# Patient Record
Sex: Female | Born: 1970
Health system: Southern US, Community
[De-identification: ages and names within clinical notes are randomized; demographics above are authoritative.]

## PROBLEM LIST (undated history)

## (undated) DIAGNOSIS — R87619 Unspecified abnormal cytological findings in specimens from cervix uteri: Secondary | ICD-10-CM

## (undated) DIAGNOSIS — IMO0002 Reserved for concepts with insufficient information to code with codable children: Secondary | ICD-10-CM

## (undated) DIAGNOSIS — A539 Syphilis, unspecified: Secondary | ICD-10-CM

## (undated) DIAGNOSIS — A549 Gonococcal infection, unspecified: Secondary | ICD-10-CM

## (undated) DIAGNOSIS — A749 Chlamydial infection, unspecified: Secondary | ICD-10-CM

## (undated) HISTORY — PX: INDUCED ABORTION: SHX677

## (undated) HISTORY — PX: TUBAL LIGATION: SHX77

---

## 2000-11-22 HISTORY — PX: TUBAL LIGATION: SHX77

## 2003-05-08 ENCOUNTER — Emergency Department (HOSPITAL_COMMUNITY): Admission: EM | Admit: 2003-05-08 | Discharge: 2003-05-08 | Payer: Self-pay | Admitting: Emergency Medicine

## 2004-03-02 ENCOUNTER — Inpatient Hospital Stay (HOSPITAL_COMMUNITY): Admission: AD | Admit: 2004-03-02 | Discharge: 2004-03-02 | Payer: Self-pay | Admitting: Obstetrics and Gynecology

## 2005-01-18 ENCOUNTER — Inpatient Hospital Stay (HOSPITAL_COMMUNITY): Admission: AD | Admit: 2005-01-18 | Discharge: 2005-01-18 | Payer: Self-pay | Admitting: Obstetrics and Gynecology

## 2006-03-20 ENCOUNTER — Inpatient Hospital Stay (HOSPITAL_COMMUNITY): Admission: AD | Admit: 2006-03-20 | Discharge: 2006-03-20 | Payer: Self-pay | Admitting: Obstetrics & Gynecology

## 2006-04-01 ENCOUNTER — Ambulatory Visit: Payer: Self-pay | Admitting: Gynecology

## 2006-12-22 ENCOUNTER — Inpatient Hospital Stay (HOSPITAL_COMMUNITY): Admission: AD | Admit: 2006-12-22 | Discharge: 2006-12-22 | Payer: Self-pay | Admitting: Obstetrics and Gynecology

## 2007-02-07 ENCOUNTER — Ambulatory Visit: Payer: Self-pay | Admitting: Internal Medicine

## 2007-02-10 ENCOUNTER — Ambulatory Visit: Payer: Self-pay | Admitting: *Deleted

## 2007-02-27 ENCOUNTER — Ambulatory Visit: Payer: Self-pay | Admitting: Internal Medicine

## 2007-03-01 ENCOUNTER — Ambulatory Visit: Payer: Self-pay | Admitting: Internal Medicine

## 2007-03-08 ENCOUNTER — Encounter (INDEPENDENT_AMBULATORY_CARE_PROVIDER_SITE_OTHER): Payer: Self-pay | Admitting: *Deleted

## 2007-03-08 ENCOUNTER — Encounter (INDEPENDENT_AMBULATORY_CARE_PROVIDER_SITE_OTHER): Payer: Self-pay | Admitting: Internal Medicine

## 2007-03-08 ENCOUNTER — Ambulatory Visit: Payer: Self-pay | Admitting: Internal Medicine

## 2007-07-20 ENCOUNTER — Encounter (INDEPENDENT_AMBULATORY_CARE_PROVIDER_SITE_OTHER): Payer: Self-pay | Admitting: Internal Medicine

## 2007-07-20 DIAGNOSIS — F17200 Nicotine dependence, unspecified, uncomplicated: Secondary | ICD-10-CM | POA: Insufficient documentation

## 2007-10-11 ENCOUNTER — Encounter (INDEPENDENT_AMBULATORY_CARE_PROVIDER_SITE_OTHER): Payer: Self-pay | Admitting: *Deleted

## 2007-12-26 ENCOUNTER — Emergency Department (HOSPITAL_COMMUNITY): Admission: EM | Admit: 2007-12-26 | Discharge: 2007-12-26 | Payer: Self-pay | Admitting: Family Medicine

## 2009-06-17 ENCOUNTER — Inpatient Hospital Stay (HOSPITAL_COMMUNITY): Admission: AD | Admit: 2009-06-17 | Discharge: 2009-06-17 | Payer: Self-pay | Admitting: Obstetrics & Gynecology

## 2009-07-25 ENCOUNTER — Emergency Department (HOSPITAL_COMMUNITY): Admission: EM | Admit: 2009-07-25 | Discharge: 2009-07-25 | Payer: Self-pay | Admitting: Emergency Medicine

## 2011-02-26 LAB — COMPREHENSIVE METABOLIC PANEL
ALT: 23 U/L (ref 0–35)
AST: 35 U/L (ref 0–37)
Albumin: 4.2 g/dL (ref 3.5–5.2)
Alkaline Phosphatase: 55 U/L (ref 39–117)
BUN: 14 mg/dL (ref 6–23)
Calcium: 9.7 mg/dL (ref 8.4–10.5)
Chloride: 107 mEq/L (ref 96–112)
Total Protein: 7.8 g/dL (ref 6.0–8.3)

## 2011-02-26 LAB — URINALYSIS, ROUTINE W REFLEX MICROSCOPIC
Leukocytes, UA: NEGATIVE
Nitrite: NEGATIVE
Protein, ur: NEGATIVE mg/dL
Specific Gravity, Urine: 1.024 (ref 1.005–1.030)
Urobilinogen, UA: 0.2 mg/dL (ref 0.0–1.0)
pH: 6.5 (ref 5.0–8.0)

## 2011-02-26 LAB — LIPASE, BLOOD: Lipase: 13 U/L (ref 11–59)

## 2011-02-26 LAB — CBC
Hemoglobin: 12.5 g/dL (ref 12.0–15.0)
RBC: 3.92 MIL/uL (ref 3.87–5.11)

## 2011-02-26 LAB — DIFFERENTIAL
Basophils Absolute: 0.1 10*3/uL (ref 0.0–0.1)
Basophils Relative: 1 % (ref 0–1)
Eosinophils Absolute: 0 10*3/uL (ref 0.0–0.7)
Eosinophils Relative: 0 % (ref 0–5)
Lymphocytes Relative: 11 % — ABNORMAL LOW (ref 12–46)
Monocytes Relative: 2 % — ABNORMAL LOW (ref 3–12)
Neutro Abs: 8.7 10*3/uL — ABNORMAL HIGH (ref 1.7–7.7)
Neutrophils Relative %: 86 % — ABNORMAL HIGH (ref 43–77)

## 2011-02-26 LAB — URINE MICROSCOPIC-ADD ON

## 2011-02-26 LAB — PREGNANCY, URINE: Preg Test, Ur: NEGATIVE

## 2011-02-28 LAB — WET PREP, GENITAL: Trich, Wet Prep: NONE SEEN

## 2011-02-28 LAB — GC/CHLAMYDIA PROBE AMP, GENITAL: Chlamydia, DNA Probe: NEGATIVE

## 2011-04-09 NOTE — Group Therapy Note (Signed)
NAME:  Crystal Meyer, Crystal Meyer NO.:  [id number]   MEDICAL RECORD NO.:  [id number]          PATIENT TYPE:  WOC   LOCATION:  [facility]                   FACILITY:  [facility]   PHYSICIAN:  Ginger Carne, MD DATE OF BIRTH:  1971-11-14   DATE OF SERVICE:  04/01/2006                                    CLINIC NOTE   This patient who presents today is a 40 year old African-American female who  has had spotting and bleeding since her last menstrual period on or about 12 March 2006.  She was seen in the maternity admission unit at [facility] [place] on 30 April.  At that time she was evaluated  including an abdominal transvaginal ultrasound which, on report, appears to  be normal.  In addition, the patient underwent a urine pregnancy test which  was negative, a urinalysis which was negative, and hemoglobin which was  11.7.  The patient was at the outset upset that there was not a female in  the clinic.  I explained to her that today there are no females available,  that we would be glad to reschedule her as such if she would prefer.  The  patient became fairly belligerent and upset and insisted that I examine her  as long as there was a chaperon in the room which I explained to her is  always the case.  The patient also accused me of not listening to her and  not giving her the time of day.  I offered her to come back another time;  however, she insisted on saying because she had taken time off from work.   With Kristen in the room, a speculum exam was performed.  There was no  evidence of lesions on the cervix, but she was actively bleeding and,  therefore, a Pap smear was not performed.  Uterus was small, anteverted, and  flexed.  Both adnexa were negative.   I explained to the patient that at this point a Pap would not be performed.  She became significantly angry and upset, explained that was what she was  sent here for.  When I explained it was not a good idea  to do so at the time  of bleeding, she became even more upset.  She also stated she was sent down  here for an ultrasound in spite of the fact that she had one performed about  10 days ago.  At this point, I brought in [place], the clinic manager, who  explained to the patient what I had essentially told her regarding her  previous testing and why we do not do Pap smears.  She became equally  belligerent and angry at [place] as well as myself.  At this point, the  patient decided to leave without any further evaluation.  The patient at  this point would not cooperate and was not willing to pursue any further  evaluation, although I explained to her that, since she is now spotting,  bleeding may stop on its own anyway.           ______________________________  Ginger Carne,  MD     SHB/MEDQ  D:  04/01/2006  T:  04/02/2006  Job:  [id number]

## 2011-11-04 ENCOUNTER — Inpatient Hospital Stay (HOSPITAL_COMMUNITY)
Admission: AD | Admit: 2011-11-04 | Discharge: 2011-11-04 | Disposition: A | Discharge status: Home / Self Care | Payer: Self-pay | Source: Ambulatory Visit | Attending: Obstetrics & Gynecology | Admitting: Obstetrics & Gynecology

## 2011-11-04 ENCOUNTER — Encounter (HOSPITAL_COMMUNITY): Payer: Self-pay | Admitting: *Deleted

## 2011-11-04 DIAGNOSIS — N390 Urinary tract infection, site not specified: Secondary | ICD-10-CM | POA: Insufficient documentation

## 2011-11-04 DIAGNOSIS — R350 Frequency of micturition: Secondary | ICD-10-CM | POA: Insufficient documentation

## 2011-11-04 HISTORY — DX: Unspecified abnormal cytological findings in specimens from cervix uteri: R87.619

## 2011-11-04 HISTORY — DX: Gonococcal infection, unspecified: A54.9

## 2011-11-04 HISTORY — DX: Reserved for concepts with insufficient information to code with codable children: IMO0002

## 2011-11-04 HISTORY — DX: Syphilis, unspecified: A53.9

## 2011-11-04 HISTORY — DX: Chlamydial infection, unspecified: A74.9

## 2011-11-04 LAB — URINALYSIS, ROUTINE W REFLEX MICROSCOPIC
Ketones, ur: NEGATIVE mg/dL
Nitrite: POSITIVE — AB
Specific Gravity, Urine: 1.02 (ref 1.005–1.030)
pH: 8 (ref 5.0–8.0)

## 2011-11-04 LAB — POCT PREGNANCY, URINE: Preg Test, Ur: NEGATIVE

## 2011-11-04 LAB — URINE MICROSCOPIC-ADD ON

## 2011-11-04 MED ORDER — CIPROFLOXACIN HCL 500 MG PO TABS: 500.0000 mg | ORAL_TABLET | Freq: Two times a day (BID) | ORAL | Status: AC

## 2011-11-04 MED ORDER — PHENAZOPYRIDINE HCL 200 MG PO TABS: 200.0000 mg | ORAL_TABLET | Freq: Three times a day (TID) | ORAL | Status: AC

## 2011-11-04 NOTE — Progress Notes (Signed)
Monday at work, had urge to urinate. Frequency, pressure and spasms after going., pinkish noted when wiping after voiding.

## 2011-11-04 NOTE — ED Provider Notes (Signed)
History   Crystal Meyer is a 40 y.o. year old [id number] female who presents to MAU reporting urgency and frequency. She denies fever, chills or flank pain.    CSN: [id number] Arrival date & time: 11/04/2011 12:23 PM   None     Chief Complaint  Patient presents with  . Urinary Frequency    (Consider location/radiation/quality/duration/timing/severity/associated sxs/prior treatment) HPI  Past Medical History  Diagnosis Date  . Abnormal Pap smear     age 40, colpo and cryo  . Gonorrhea   . Chlamydia   . Syphilis     Past Surgical History  Procedure Date  . Cesarean section   . Induced abortion     x2  . Tubal ligation     Family History  Problem Relation Age of Onset  . Anesthesia problems Neg Hx     History  Substance Use Topics  . Smoking status: Current Everyday Smoker -- 0.2 packs/day for 20 years  . Smokeless tobacco: Never Used  . Alcohol Use: Yes    OB History    Grav Para Term Preterm Abortions TAB SAB Ect Mult Living   4 2 2  0 2 2 0 0 0 2      Review of Systems: Otherwise negative  Allergies  Review of patient's allergies indicates not on file.  Home Medications  No current outpatient prescriptions on file.  BP 128/87  Pulse 74  Temp(Src) 98 F (36.7 C) (Oral)  Resp 20  Ht 5\' 2"  (1.575 m)  Wt 53.524 kg (118 lb)  BMI 21.58 kg/m2  SpO2 99%  LMP 10/01/2011  Physical Exam: NAD, A&Ox 4 Abd NT, no CVAT  ED Course  Procedures (including critical care time)  Labs Reviewed  URINALYSIS, ROUTINE W REFLEX MICROSCOPIC - Abnormal; Notable for the following:    APPearance CLOUDY (*)    Hgb urine dipstick LARGE (*)    Protein, ur 30 (*)    Nitrite POSITIVE (*)    Leukocytes, UA MODERATE (*)    All other components within normal limits  URINE MICROSCOPIC-ADD ON - Abnormal; Notable for the following:    Squamous Epithelial / LPF FEW (*)    Bacteria, UA MANY (*)    All other components within normal limits  POCT PREGNANCY, URINE  POCT  PREGNANCY, URINE    1. UTI (lower urinary tract infection)     MDM  Rx Cipro. F/U PRN if no improvement in 48 hours Increase fluids  Dorathy Kinsman 11/04/2011 05:59 PM

## 2011-11-09 NOTE — ED Provider Notes (Signed)
Attestation of Attending Supervision of Advanced Practitioner: Evaluation and management procedures were performed by the PA/NP/CNM/OB Fellow under my supervision/collaboration. Chart reviewed, and agree with management and plan.  Nitasha Jewel, M.D. 11/09/2011 10:02 AM   

## 2012-09-02 ENCOUNTER — Encounter (HOSPITAL_COMMUNITY): Payer: Self-pay | Admitting: Obstetrics and Gynecology

## 2012-09-02 ENCOUNTER — Inpatient Hospital Stay (HOSPITAL_COMMUNITY)
Admission: AD | Admit: 2012-09-02 | Discharge: 2012-09-02 | Disposition: A | Discharge status: Home / Self Care | Payer: Self-pay | Source: Ambulatory Visit | Attending: Obstetrics and Gynecology | Admitting: Obstetrics and Gynecology

## 2012-09-02 DIAGNOSIS — N760 Acute vaginitis: Secondary | ICD-10-CM | POA: Insufficient documentation

## 2012-09-02 DIAGNOSIS — A499 Bacterial infection, unspecified: Secondary | ICD-10-CM

## 2012-09-02 DIAGNOSIS — B9689 Other specified bacterial agents as the cause of diseases classified elsewhere: Secondary | ICD-10-CM | POA: Insufficient documentation

## 2012-09-02 DIAGNOSIS — R109 Unspecified abdominal pain: Secondary | ICD-10-CM | POA: Insufficient documentation

## 2012-09-02 LAB — CBC
HCT: 38.4 % (ref 36.0–46.0)
Hemoglobin: 12.6 g/dL (ref 12.0–15.0)
MCH: 30.2 pg (ref 26.0–34.0)
MCHC: 32.8 g/dL (ref 30.0–36.0)
MCV: 92.1 fL (ref 78.0–100.0)
Platelets: 310 10*3/uL (ref 150–400)
RBC: 4.17 MIL/uL (ref 3.87–5.11)
RDW: 15.3 % (ref 11.5–15.5)
WBC: 6 10*3/uL (ref 4.0–10.5)

## 2012-09-02 LAB — WET PREP, GENITAL
Trich, Wet Prep: NONE SEEN
Yeast Wet Prep HPF POC: NONE SEEN

## 2012-09-02 LAB — URINALYSIS, ROUTINE W REFLEX MICROSCOPIC
Bilirubin Urine: NEGATIVE
Hgb urine dipstick: NEGATIVE
Ketones, ur: NEGATIVE mg/dL
Leukocytes, UA: NEGATIVE
Urobilinogen, UA: 0.2 mg/dL (ref 0.0–1.0)
pH: 5.5 (ref 5.0–8.0)

## 2012-09-02 LAB — POCT PREGNANCY, URINE: Preg Test, Ur: NEGATIVE

## 2012-09-02 MED ORDER — METRONIDAZOLE 500 MG PO TABS: 500.0000 mg | ORAL_TABLET | Freq: Two times a day (BID) | ORAL | Status: DC

## 2012-09-02 NOTE — MAU Note (Signed)
Pt presents to MAU with complaints of sharp abdominal pain, decreased appetite and weight loss. Pt says this has been going on for about 2 weeks. Pt says after she eats or when she is walking she will feel a random sharp pain in her stomach.

## 2012-09-02 NOTE — MAU Provider Note (Signed)
History     CSN: [id number]  Arrival date & time 09/02/12  0936   None     Chief Complaint  Patient presents with  . Abdominal Cramping  . Weight Loss    (Consider location/radiation/quality/duration/timing/severity/associated sxs/prior treatment) HPI Crystal Meyer is a 42 y.o. [id number]. LMP 08/11/12, s/p BTL. She c/o dull, achey bil low abd pain off/on x 2 wks. She also c/o decreased appetite, occ headaches. No change in discharge, no UTI S&S or GI changes, nl BM this am. Same partner x 2 yr. Past Medical History  Diagnosis Date  . Abnormal Pap smear     age 5, colpo and cryo  . Gonorrhea   . Chlamydia   . Syphilis     Past Surgical History  Procedure Date  . Cesarean section   . Induced abortion     x2  . Tubal ligation     Family History  Problem Relation Age of Onset  . Anesthesia problems Neg Hx     History  Substance Use Topics  . Smoking status: Current Every Day Smoker -- 0.2 packs/day for 20 years  . Smokeless tobacco: Never Used  . Alcohol Use: Yes    OB History    Grav Para Term Preterm Abortions TAB SAB Ect Mult Living   4 2 2  0 2 2 0 0 0 2      Review of Systems  Constitutional: Positive for appetite change. Negative for fever and chills.  Gastrointestinal: Negative.   Genitourinary: Negative for dysuria, urgency, frequency, hematuria, vaginal bleeding and vaginal discharge.    Allergies  Aspirin  Home Medications  No current outpatient prescriptions on file.  BP 126/83  Pulse 98  Temp 98.6 F (37 C) (Oral)  Resp 18  LMP 08/11/2012  Physical Exam  Constitutional: She is oriented to person, place, and time. She appears well-developed and well-nourished.  Abdominal: Soft. Bowel sounds are normal. She exhibits no distension and no mass. There is tenderness. There is no rebound and no guarding.  Genitourinary:       Pelvic: V/V-nl anatomy, skin intact. Scant thick white d/c Cx-closed Uterus-nontender, nl size Adn- no masses  palp, non tender  Musculoskeletal: Normal range of motion.  Neurological: She is alert and oriented to person, place, and time.  Skin: Skin is warm and dry.  Psychiatric: She has a normal mood and affect. Her behavior is normal.    ED Course  Procedures (including critical care time)   Labs Reviewed  URINALYSIS, ROUTINE W REFLEX MICROSCOPIC  POCT PREGNANCY, URINE  WET PREP, GENITAL  GC/CHLAMYDIA PROBE AMP, GENITAL   No results found. Results for orders placed during the hospital encounter of 09/02/12 (from the past 24 hour(s))  URINALYSIS, ROUTINE W REFLEX MICROSCOPIC     Status: Normal   Collection Time   09/02/12  9:48 AM      Component Value Range   Color, Urine YELLOW  YELLOW   APPearance CLEAR  CLEAR   Specific Gravity, Urine 1.025  1.005 - 1.030   pH 5.5  5.0 - 8.0   Glucose, UA NEGATIVE  NEGATIVE mg/dL   Hgb urine dipstick NEGATIVE  NEGATIVE   Bilirubin Urine NEGATIVE  NEGATIVE   Ketones, ur NEGATIVE  NEGATIVE mg/dL   Protein, ur NEGATIVE  NEGATIVE mg/dL   Urobilinogen, UA 0.2  0.0 - 1.0 mg/dL   Nitrite NEGATIVE  NEGATIVE   Leukocytes, UA NEGATIVE  NEGATIVE  POCT PREGNANCY, URINE     Status:  Normal   Collection Time   09/02/12  9:49 AM      Component Value Range   Preg Test, Ur NEGATIVE  NEGATIVE  WET PREP, GENITAL     Status: Abnormal   Collection Time   09/02/12 10:12 AM      Component Value Range   Yeast Wet Prep HPF POC NONE SEEN  NONE SEEN   Trich, Wet Prep NONE SEEN  NONE SEEN   Clue Cells Wet Prep HPF POC MODERATE (*) NONE SEEN   WBC, Wet Prep HPF POC FEW (*) NONE SEEN  CBC     Status: Normal   Collection Time   09/02/12 10:58 AM      Component Value Range   WBC 6.0  4.0 - 10.5 K/uL   RBC 4.17  3.87 - 5.11 MIL/uL   Hemoglobin 12.6  12.0 - 15.0 g/dL   HCT 24.4  01.0 - 27.2 %   MCV 92.1  78.0 - 100.0 fL   MCH 30.2  26.0 - 34.0 pg   MCHC 32.8  30.0 - 36.0 g/dL   RDW 53.6  64.4 - 03.4 %   Platelets 310  150 - 400 K/uL     No diagnosis  found.    MDM  ASSESSMENT:  Bacterial vaginosis Abd pain probably not GYN in origin   PLAN:  Flagyl 500 bid x 7d If symptoms continue to f/u with general medical provider

## 2012-09-03 NOTE — MAU Provider Note (Signed)
Attestation of Attending Supervision of Advanced Practitioner (CNM/NP): Evaluation and management procedures were performed by the Advanced Practitioner under my supervision and collaboration.  I have reviewed the Advanced Practitioner's note and chart, and I agree with the management and plan.  Paz Winsett 09/03/2012 5:54 AM

## 2012-09-04 LAB — GC/CHLAMYDIA PROBE AMP, GENITAL
Chlamydia, DNA Probe: NEGATIVE
GC Probe Amp, Genital: NEGATIVE

## 2012-12-20 ENCOUNTER — Inpatient Hospital Stay (HOSPITAL_COMMUNITY)
Admission: AD | Admit: 2012-12-20 | Discharge: 2012-12-20 | Disposition: A | Discharge status: Home / Self Care | Payer: Self-pay | Source: Ambulatory Visit | Attending: Obstetrics & Gynecology | Admitting: Obstetrics & Gynecology

## 2012-12-20 ENCOUNTER — Encounter (HOSPITAL_COMMUNITY): Payer: Self-pay | Admitting: *Deleted

## 2012-12-20 DIAGNOSIS — N760 Acute vaginitis: Secondary | ICD-10-CM | POA: Insufficient documentation

## 2012-12-20 DIAGNOSIS — M545 Low back pain, unspecified: Secondary | ICD-10-CM | POA: Insufficient documentation

## 2012-12-20 DIAGNOSIS — L293 Anogenital pruritus, unspecified: Secondary | ICD-10-CM | POA: Insufficient documentation

## 2012-12-20 DIAGNOSIS — B9689 Other specified bacterial agents as the cause of diseases classified elsewhere: Secondary | ICD-10-CM | POA: Insufficient documentation

## 2012-12-20 DIAGNOSIS — A499 Bacterial infection, unspecified: Secondary | ICD-10-CM

## 2012-12-20 LAB — URINALYSIS, ROUTINE W REFLEX MICROSCOPIC
Bilirubin Urine: NEGATIVE
Hgb urine dipstick: NEGATIVE
Ketones, ur: NEGATIVE mg/dL
Nitrite: NEGATIVE
Protein, ur: NEGATIVE mg/dL
Specific Gravity, Urine: >1.03 — ABNORMAL HIGH (ref 1.005–1.030)
Urobilinogen, UA: 0.2 mg/dL (ref 0.0–1.0)

## 2012-12-20 LAB — WET PREP, GENITAL: Yeast Wet Prep HPF POC: NONE SEEN

## 2012-12-20 MED ORDER — METRONIDAZOLE 500 MG PO TABS: 500.0000 mg | ORAL_TABLET | Freq: Two times a day (BID) | ORAL | Status: DC

## 2012-12-20 NOTE — MAU Note (Signed)
Patient states she has had lower abdominal and low back pain for 2 days. Has had a vaginal itching and irritation for 4-5 days. Slight burning with urination.

## 2012-12-20 NOTE — MAU Provider Note (Signed)
History     CSN: [id number]  Arrival date and time: 12/20/12 1646   First Provider Initiated Contact with Patient 12/20/12 1820      Chief Complaint  Patient presents with  . Abdominal Pain  . Back Pain   HPI 42 y.o. [id number] with vaginal itching and irritation x 4-5 days, some vaginal discharge with odor, also mild low abd and low back pain.   Past Medical History  Diagnosis Date  . Abnormal Pap smear     age 61, colpo and cryo  . Gonorrhea   . Chlamydia   . Syphilis     Past Surgical History  Procedure Date  . Cesarean section   . Induced abortion     x2  . Tubal ligation     Family History  Problem Relation Age of Onset  . Anesthesia problems Neg Hx     History  Substance Use Topics  . Smoking status: Current Every Day Smoker -- 0.2 packs/day for 20 years    Types: Cigarettes  . Smokeless tobacco: Never Used  . Alcohol Use: Yes    Allergies:  Allergies  Allergen Reactions  . Aspirin     Childhood reaction    Prescriptions prior to admission  Medication Sig Dispense Refill  . ibuprofen (ADVIL,MOTRIN) 200 MG tablet Take 600 mg by mouth every 6 (six) hours as needed. For menstrual cramps      . hydrocortisone cream 1 % Apply 1 application topically 2 (two) times daily. For eczema      . metroNIDAZOLE (FLAGYL) 500 MG tablet Take 1 tablet (500 mg total) by mouth 2 (two) times daily.  14 tablet  0    Review of Systems  Constitutional: Negative.   Respiratory: Negative.   Cardiovascular: Negative.   Gastrointestinal: Positive for abdominal pain. Negative for nausea, vomiting, diarrhea and constipation.  Genitourinary: Negative for dysuria, urgency, frequency, hematuria and flank pain.       Negative for vaginal bleeding, + vaginal discharge  Musculoskeletal: Positive for back pain.  Neurological: Negative.   Psychiatric/Behavioral: Negative.    Physical Exam   Blood pressure 116/70, pulse 80, temperature 97.7 F (36.5 C), temperature source Oral,  resp. rate 16, height 5' 2.5" (1.588 m), weight 118 lb 3.2 oz (53.615 kg), last menstrual period 11/15/2012, SpO2 100.00%.  Physical Exam  Nursing note and vitals reviewed. Constitutional: She is oriented to person, place, and time. She appears well-developed and well-nourished. No distress.  HENT:  Head: Normocephalic and atraumatic.  Cardiovascular: Normal rate.   Respiratory: Effort normal. No respiratory distress.  GI: Soft. She exhibits no distension and no mass. There is no tenderness. There is no rebound and no guarding.  Genitourinary: There is no rash or lesion on the right labia. There is no rash or lesion on the left labia. Uterus is not deviated, not enlarged, not fixed and not tender. Cervix exhibits no motion tenderness, no discharge and no friability. Right adnexum displays tenderness (mild). Right adnexum displays no mass and no fullness. Left adnexum displays tenderness (mild). Left adnexum displays no mass and no fullness. No erythema, tenderness or bleeding around the vagina. Vaginal discharge (white) found.  Neurological: She is alert and oriented to person, place, and time.  Skin: Skin is warm and dry.  Psychiatric: She has a normal mood and affect.    MAU Course  Procedures Results for orders placed during the hospital encounter of 12/20/12 (from the past 24 hour(s))  POCT PREGNANCY, URINE  Status: Normal   Collection Time   12/20/12  6:06 PM      Component Value Range   Preg Test, Ur NEGATIVE  NEGATIVE  WET PREP, GENITAL     Status: Abnormal   Collection Time   12/20/12  6:23 PM      Component Value Range   Yeast Wet Prep HPF POC NONE SEEN  NONE SEEN   Trich, Wet Prep NONE SEEN  NONE SEEN   Clue Cells Wet Prep HPF POC FEW (*) NONE SEEN   WBC, Wet Prep HPF POC FEW (*) NONE SEEN     Assessment and Plan   1. BV (bacterial vaginosis)       Medication List     As of 12/20/2012  6:56 PM    CONTINUE taking these medications         hydrocortisone cream 1  %      ibuprofen 200 MG tablet   Commonly known as: ADVIL,MOTRIN      metroNIDAZOLE 500 MG tablet   Commonly known as: FLAGYL   Take 1 tablet (500 mg total) by mouth 2 (two) times daily.          Where to get your medications    These are the prescriptions that you need to pick up.   You may get these medications from any pharmacy.         metroNIDAZOLE 500 MG tablet            Follow-up Information    Follow up with [facility] [place]. (As needed)    Contact information:   [address] [place] [address] [phone]           Raylan Troiani 12/20/2012, 6:32 PM

## 2012-12-21 LAB — GC/CHLAMYDIA PROBE AMP
CT Probe RNA: NEGATIVE
GC Probe RNA: NEGATIVE

## 2014-09-23 ENCOUNTER — Encounter (HOSPITAL_COMMUNITY): Payer: Self-pay | Admitting: *Deleted

## 2015-05-08 ENCOUNTER — Emergency Department (HOSPITAL_COMMUNITY)
Admission: EM | Admit: 2015-05-08 | Discharge: 2015-05-08 | Disposition: A | Discharge status: Home / Self Care | Payer: Self-pay | Attending: Emergency Medicine | Admitting: Emergency Medicine

## 2015-05-08 ENCOUNTER — Encounter (HOSPITAL_COMMUNITY): Payer: Self-pay | Admitting: Emergency Medicine

## 2015-05-08 DIAGNOSIS — R1084 Generalized abdominal pain: Secondary | ICD-10-CM | POA: Insufficient documentation

## 2015-05-08 DIAGNOSIS — Z3202 Encounter for pregnancy test, result negative: Secondary | ICD-10-CM | POA: Insufficient documentation

## 2015-05-08 DIAGNOSIS — Z8619 Personal history of other infectious and parasitic diseases: Secondary | ICD-10-CM | POA: Insufficient documentation

## 2015-05-08 DIAGNOSIS — Z720 Tobacco use: Secondary | ICD-10-CM | POA: Insufficient documentation

## 2015-05-08 DIAGNOSIS — R197 Diarrhea, unspecified: Secondary | ICD-10-CM | POA: Insufficient documentation

## 2015-05-08 DIAGNOSIS — Z7952 Long term (current) use of systemic steroids: Secondary | ICD-10-CM | POA: Insufficient documentation

## 2015-05-08 LAB — URINALYSIS, ROUTINE W REFLEX MICROSCOPIC
Bilirubin Urine: NEGATIVE
Glucose, UA: NEGATIVE mg/dL
HGB URINE DIPSTICK: NEGATIVE
Ketones, ur: NEGATIVE mg/dL
Leukocytes, UA: NEGATIVE
NITRITE: NEGATIVE
PROTEIN: 100 mg/dL — AB
SPECIFIC GRAVITY, URINE: 1.023 (ref 1.005–1.030)
UROBILINOGEN UA: 0.2 mg/dL (ref 0.0–1.0)
pH: 8.5 — ABNORMAL HIGH (ref 5.0–8.0)

## 2015-05-08 LAB — CBC WITH DIFFERENTIAL/PLATELET
Basophils Absolute: 0 10*3/uL (ref 0.0–0.1)
Basophils Relative: 0 % (ref 0–1)
Eosinophils Absolute: 0.2 10*3/uL (ref 0.0–0.7)
Eosinophils Relative: 3 % (ref 0–5)
HCT: 36.8 % (ref 36.0–46.0)
Hemoglobin: 11.8 g/dL — ABNORMAL LOW (ref 12.0–15.0)
LYMPHS PCT: 20 % (ref 12–46)
Lymphs Abs: 1.5 10*3/uL (ref 0.7–4.0)
MCH: 27.2 pg (ref 26.0–34.0)
MCHC: 32.1 g/dL (ref 30.0–36.0)
MCV: 84.8 fL (ref 78.0–100.0)
MONO ABS: 0.4 10*3/uL (ref 0.1–1.0)
Monocytes Relative: 5 % (ref 3–12)
NEUTROS ABS: 5.6 10*3/uL (ref 1.7–7.7)
NEUTROS PCT: 72 % (ref 43–77)
PLATELETS: 404 10*3/uL — AB (ref 150–400)
RBC: 4.34 MIL/uL (ref 3.87–5.11)
RDW: 17.1 % — AB (ref 11.5–15.5)
WBC: 7.7 10*3/uL (ref 4.0–10.5)

## 2015-05-08 LAB — COMPREHENSIVE METABOLIC PANEL
ALBUMIN: 4.3 g/dL (ref 3.5–5.0)
ALT: 15 U/L (ref 14–54)
AST: 24 U/L (ref 15–41)
Alkaline Phosphatase: 67 U/L (ref 38–126)
Anion gap: 10 (ref 5–15)
BUN: 11 mg/dL (ref 6–20)
CALCIUM: 9.7 mg/dL (ref 8.9–10.3)
CO2: 29 mmol/L (ref 22–32)
Chloride: 103 mmol/L (ref 101–111)
Creatinine, Ser: 0.77 mg/dL (ref 0.44–1.00)
GFR calc non Af Amer: >60 mL/min (ref >60)
GLUCOSE: 105 mg/dL — AB (ref 65–99)
Potassium: 4.1 mmol/L (ref 3.5–5.1)
SODIUM: 142 mmol/L (ref 135–145)
TOTAL PROTEIN: 8.3 g/dL — AB (ref 6.5–8.1)
Total Bilirubin: 0.5 mg/dL (ref 0.3–1.2)

## 2015-05-08 LAB — URINE MICROSCOPIC-ADD ON

## 2015-05-08 LAB — I-STAT TROPONIN, ED: TROPONIN I, POC: 0 ng/mL (ref 0.00–0.08)

## 2015-05-08 LAB — LIPASE, BLOOD: Lipase: <10 U/L — ABNORMAL LOW (ref 22–51)

## 2015-05-08 LAB — POC URINE PREG, ED: Preg Test, Ur: NEGATIVE

## 2015-05-08 MED ORDER — DICYCLOMINE HCL 20 MG PO TABS: 20.0000 mg | ORAL_TABLET | Freq: Two times a day (BID) | ORAL | Status: DC

## 2015-05-08 NOTE — ED Notes (Signed)
Pt states that she has been having gen abd burning x 2 days with diarrhea.  No NV.

## 2015-05-08 NOTE — ED Provider Notes (Signed)
CSN: [id number]     Arrival date & time 05/08/15  1125 History   First MD Initiated Contact with Patient 05/08/15 1155     Chief Complaint  Patient presents with  . Abdominal Pain  . Diarrhea     (Consider location/radiation/quality/duration/timing/severity/associated sxs/prior Treatment) HPI Comments: Patient presents today with complaints of diffuse abdominal pain and diarrhea.  She states that symptoms have been present for the past 2 days.  She describes the pain as intermittent abdominal "spasms" and also a burning pain.  She states that pain typically lasts a few minutes and then resolves.  She denies abdominal pain at this time.  She has not taken anything for symptoms prior to arrival.  She reports 2-3 episodes of diarrhea today.  No blood in her stool.  She denies nausea or vomiting.  No fever or chills.  No urinary symptoms.  No known sick contacts.  No recent foreign travel.    Patient is a 44 y.o. female presenting with abdominal pain and diarrhea. The history is provided by the patient.  Abdominal Pain Associated symptoms: diarrhea   Diarrhea Associated symptoms: abdominal pain     Past Medical History  Diagnosis Date  . Abnormal Pap smear     age 75, colpo and cryo  . Gonorrhea   . Chlamydia   . Syphilis    Past Surgical History  Procedure Laterality Date  . Cesarean section    . Induced abortion      x2  . Tubal ligation     Family History  Problem Relation Age of Onset  . Anesthesia problems Neg Hx    History  Substance Use Topics  . Smoking status: Current Every Day Smoker -- 0.25 packs/day for 20 years    Types: Cigarettes  . Smokeless tobacco: Never Used  . Alcohol Use: Yes   OB History    Gravida Para Term Preterm AB TAB SAB Ectopic Multiple Living   4 2 2  0 2 2 0 0 0 2     Review of Systems  Gastrointestinal: Positive for abdominal pain and diarrhea.  All other systems reviewed and are negative.     Allergies  Aspirin  Home Medications    Prior to Admission medications   Medication Sig Start Date End Date Taking? Authorizing Provider  hydrocortisone cream 1 % Apply 1 application topically 2 (two) times daily as needed for itching. For eczema   Yes Historical Provider, MD  ibuprofen (ADVIL,MOTRIN) 200 MG tablet Take 600 mg by mouth every 6 (six) hours as needed. For menstrual cramps   Yes Historical Provider, MD   BP 123/81 mmHg  Pulse 84  Temp(Src) 98.5 F (36.9 C) (Oral)  Resp 16  SpO2 100%  LMP 04/07/2015 (Approximate) Physical Exam  Constitutional: She appears well-developed and well-nourished.  HENT:  Head: Normocephalic and atraumatic.  Mouth/Throat: Oropharynx is clear and moist.  Neck: Normal range of motion. Neck supple.  Cardiovascular: Normal rate, regular rhythm and normal heart sounds.   Pulmonary/Chest: Effort normal and breath sounds normal.  Abdominal: Soft. Bowel sounds are normal. She exhibits no distension and no mass. There is no tenderness. There is no rebound and no guarding.  Musculoskeletal: Normal range of motion.  Neurological: She is alert.  Skin: Skin is warm and dry.  Psychiatric: She has a normal mood and affect.  Nursing note and vitals reviewed.   ED Course  Procedures (including critical care time) Labs Review Labs Reviewed  CBC WITH DIFFERENTIAL/PLATELET - Abnormal;  Notable for the following:    Hemoglobin 11.8 (*)    RDW 17.1 (*)    Platelets 404 (*)    All other components within normal limits  COMPREHENSIVE METABOLIC PANEL - Abnormal; Notable for the following:    Glucose, Bld 105 (*)    Total Protein 8.3 (*)    All other components within normal limits  URINALYSIS, ROUTINE W REFLEX MICROSCOPIC (NOT AT [facility]) - Abnormal; Notable for the following:    pH 8.5 (*)    Protein, ur 100 (*)    All other components within normal limits  URINE MICROSCOPIC-ADD ON - Abnormal; Notable for the following:    Squamous Epithelial / LPF FEW (*)    Bacteria, UA FEW (*)    All other  components within normal limits  LIPASE, BLOOD  POC URINE PREG, ED  I-STAT TROPOININ, ED    Imaging Review No results found.   EKG Interpretation None      MDM   Final diagnoses:  None   Patient presents today with complaints of diffuse abdominal pain and diarrhea x 2 days.  She denies hematochezia or melena.  Abdomen is soft and non tender on exam.  Patient afebrile.  Labs unremarkable.  UA is negative for infection.  No vomiting or diarrhea during ED course.  Feel that the patient is stable for discharge.  Return precautions given.    Hyman Bible, PA-C 05/08/15 [place], MD 05/08/15 [phone]

## 2017-12-13 ENCOUNTER — Telehealth: Payer: Self-pay | Admitting: Family Medicine

## 2017-12-13 NOTE — Telephone Encounter (Signed)
Copied from [place]. Topic: General - Other >> Dec 13, 2017 11:22 AM Marin Olp L wrote: Called patient and left vmail to confirm that 4pm appt time on this Thursday 12/15/2016 works for her? The original appt was scheduled by an agent in the wrong patients chart and against preferences RE no back to back new patients at 2:30. Upon fixing the appt to the right patient and putting it in a 3pm slot instead, I forgot to change the length to 1 hour. I moved the appt to 4pm to accomodate her 93min length for new patients and no back to back new pt rule. We are just needed to confirm whether or not the 4pm time will work for the patient.   I left a voice message for pt to return my call, would like to confirm upcoming appointment this week.

## 2017-12-15 ENCOUNTER — Ambulatory Visit (INDEPENDENT_AMBULATORY_CARE_PROVIDER_SITE_OTHER): Payer: 59 | Admitting: Family Medicine

## 2017-12-15 ENCOUNTER — Encounter: Payer: Self-pay | Admitting: Family Medicine

## 2017-12-15 VITALS — BP 112/82 | HR 83 | Temp 98.6°F | Ht 64.0 in | Wt 143.5 lb

## 2017-12-15 DIAGNOSIS — Z862 Personal history of diseases of the blood and blood-forming organs and certain disorders involving the immune mechanism: Secondary | ICD-10-CM | POA: Diagnosis not present

## 2017-12-15 DIAGNOSIS — Z13220 Encounter for screening for lipoid disorders: Secondary | ICD-10-CM

## 2017-12-15 DIAGNOSIS — Z114 Encounter for screening for human immunodeficiency virus [HIV]: Secondary | ICD-10-CM | POA: Diagnosis not present

## 2017-12-15 DIAGNOSIS — F17210 Nicotine dependence, cigarettes, uncomplicated: Secondary | ICD-10-CM | POA: Diagnosis not present

## 2017-12-15 DIAGNOSIS — F5089 Other specified eating disorder: Secondary | ICD-10-CM

## 2017-12-15 DIAGNOSIS — Z7689 Persons encountering health services in other specified circumstances: Secondary | ICD-10-CM | POA: Diagnosis not present

## 2017-12-15 DIAGNOSIS — Z131 Encounter for screening for diabetes mellitus: Secondary | ICD-10-CM

## 2017-12-15 DIAGNOSIS — Z113 Encounter for screening for infections with a predominantly sexual mode of transmission: Secondary | ICD-10-CM | POA: Diagnosis not present

## 2017-12-15 NOTE — Progress Notes (Signed)
Patient presents to clinic today to establish care.  SUBJECTIVE: PMH: Patient is a 47 year old female with past medical history significant for tobacco use.  Patient states she has not been seen anywhere in recent years secondary to not having insurance.  Patient states she is due for Pap as her last one was in 1992.  Pt is a G4P2.  Patient endorses having heavy menstrual cycles causing her to be anemic in the past.  Patient is not currently taking iron supplements.  Patient endorses a continued craving for ice.   Pt enjoys the ice at the hospital.  Pt is interested in having labs this visit.  She would like to schedule a CPE in the future.  Allergies: Aspirin-reaction as a child states made her "dehydrated"  Past surgical history: C-section times x 2 1997 and 2002  Social history: Patient is single.  She has 2 children.  Patient is currently employed as a Chartered certified accountant.  Patient endorses cigarette use.  She is currently smoking 6-10 cigarettes/day.  Patient has been smoking since age 31.  Patient has thought about quitting.  Patient endorses social alcohol use.  Patient denies drug use.  Family Medical Hx: Dad- DM and HTN   Past Medical History:  Diagnosis Date  . Abnormal Pap smear    age 53, colpo and cryo  . Chlamydia   . Gonorrhea   . Syphilis     Past Surgical History:  Procedure Laterality Date  . CESAREAN SECTION    . INDUCED ABORTION     x2  . TUBAL LIGATION      Current Outpatient Medications on File Prior to Visit  Medication Sig Dispense Refill  . dicyclomine (BENTYL) 20 MG tablet Take 1 tablet (20 mg total) by mouth 2 (two) times daily. (Patient not taking: Reported on 12/15/2017) 10 tablet 0  . hydrocortisone cream 1 % Apply 1 application topically 2 (two) times daily as needed for itching. For eczema    . ibuprofen (ADVIL,MOTRIN) 200 MG tablet Take 600 mg by mouth every 6 (six) hours as needed. For menstrual cramps     No current facility-administered  medications on file prior to visit.     Allergies  Allergen Reactions  . Aspirin     Childhood reaction- dehydrated gave too many baby aspirins  . Hydrocodone     Family History  Problem Relation Age of Onset  . Diabetes Father   . Hypertension Father   . Anesthesia problems Neg Hx     Social History   Socioeconomic History  . Marital status: Single    Spouse name: Not on file  . Number of children: 2  . Years of education: Not on file  . Highest education level: Not on file  Social Needs  . Financial resource strain: Not on file  . Food insecurity - worry: Not on file  . Food insecurity - inability: Not on file  . Transportation needs - medical: Not on file  . Transportation needs - non-medical: Not on file  Occupational History  . Not on file  Tobacco Use  . Smoking status: Current Every Day Smoker    Packs/day: 0.25    Years: 20.00    Pack years: 5.00    Types: Cigarettes  . Smokeless tobacco: Never Used  Substance and Sexual Activity  . Alcohol use: Yes  . Drug use: No  . Sexual activity: Yes    Birth control/protection: Surgical  Other Topics Concern  . Not on  file  Social History Narrative  . Not on file    ROS General: Denies fever, chills, night sweats, changes in weight, changes in appetite  + craving ice HEENT: Denies headaches, ear pain, changes in vision, rhinorrhea, sore throat CV: Denies CP, palpitations, SOB, orthopnea Pulm: Denies SOB, cough, wheezing GI: Denies abdominal pain, nausea, vomiting, diarrhea, constipation GU: Denies dysuria, hematuria, frequency, vaginal discharge Msk: Denies muscle cramps, joint pains Neuro: Denies weakness, numbness, tingling Skin: Denies rashes, bruising Psych: Denies depression, anxiety, hallucinations  BP 112/82 (BP Location: Right Arm, Patient Position: Sitting, Cuff Size: Normal)   Pulse 83   Temp 98.6 F (37 C) (Oral)   Ht 5\' 4"  (1.626 m)   Wt 143 lb 8 oz (65.1 kg)   SpO2 99%   BMI 24.63 kg/m     Physical Exam Gen. Pleasant, well developed, well-nourished, in NAD HEENT - [place]/AT, PERRL, EOMI, conjunctive clear, no scleral icterus, no nasal drainage, pharynx without erythema or exudate.  TMs normal bilaterally.  No cervical lymphadenopathy. Lungs: no use of accessory muscles, CTAB, no wheezes, rales or rhonchi Cardiovascular: RRR, No r/g/m, no peripheral edema Abdomen: BS present, soft, nontender,nondistended Musculoskeletal: No deformities, moves all four extremities, no cyanosis or clubbing, normal tone Neuro:  A&Ox3, CN II-XII intact, normal gait Skin:  Warm, dry, intact, no lesions Psych: normal affect, mood appropriate  No results found for this or any previous visit (from the past 2160 hour(s)).  Assessment/Plan: Cigarette nicotine dependence without complication -Currently smoking 6-10 cigarettes/day since age 89 -Smoking cessation counseling greater than 3 minutes, less than 10 minutes -Discussed various options to help patient quit including nicotine patches, gum, pills. -Patient is currently considering cutting down. -We will continue to assess each visit.  Screening for cholesterol level  - Plan: Lipid panel  Routine screening for STI (sexually transmitted infection)  - Plan: RPR, C. trachomatis/N. gonorrhoeae RNA  Screening for HIV (human immunodeficiency virus)  - Plan: HIV antibody (with reflex)  History of anemia  - Plan: CBC with Differential/Platelet  Pathologic ice eating  - Plan: CBC with Differential/Platelet, Comprehensive metabolic panel  Screening for diabetes mellitus  - Plan: Hemoglobin A1c  Encounter to establish care -We reviewed the PMH, PSH, FH, SH, Meds and Allergies. -We provided refills for any medications we will prescribe as needed. -We addressed current concerns per orders and patient instructions. -We have asked for records for pertinent exams, studies, vaccines and notes from previous providers. -We have advised patient to  follow up per instructions below.  F/u in the next few wks to months for CPE/PAP.  Grier Mitts, MD

## 2017-12-16 LAB — COMPREHENSIVE METABOLIC PANEL
ALK PHOS: 76 U/L (ref 39–117)
ALT: 23 U/L (ref 0–35)
AST: 23 U/L (ref 0–37)
Albumin: 4 g/dL (ref 3.5–5.2)
BUN: 11 mg/dL (ref 6–23)
CO2: 25 meq/L (ref 19–32)
Calcium: 9.4 mg/dL (ref 8.4–10.5)
Chloride: 102 mEq/L (ref 96–112)
Creatinine, Ser: 0.69 mg/dL (ref 0.40–1.20)
GFR: 118.64 mL/min (ref >60.00)
GLUCOSE: 90 mg/dL (ref 70–99)
POTASSIUM: 4.5 meq/L (ref 3.5–5.1)
SODIUM: 136 meq/L (ref 135–145)
TOTAL PROTEIN: 7.1 g/dL (ref 6.0–8.3)
Total Bilirubin: 0.2 mg/dL (ref 0.2–1.2)

## 2017-12-16 LAB — CBC WITH DIFFERENTIAL/PLATELET
BASOS PCT: 0.3 % (ref 0.0–3.0)
Basophils Absolute: 0 10*3/uL (ref 0.0–0.1)
EOS ABS: 0.4 10*3/uL (ref 0.0–0.7)
EOS PCT: 4.8 % (ref 0.0–5.0)
HCT: 31.9 % — ABNORMAL LOW (ref 36.0–46.0)
Hemoglobin: 10 g/dL — ABNORMAL LOW (ref 12.0–15.0)
LYMPHS ABS: 1.9 10*3/uL (ref 0.7–4.0)
Lymphocytes Relative: 23.8 % (ref 12.0–46.0)
MCHC: 31.3 g/dL (ref 30.0–36.0)
MCV: 79.4 fl (ref 78.0–100.0)
MONO ABS: 0.4 10*3/uL (ref 0.1–1.0)
Monocytes Relative: 5.5 % (ref 3.0–12.0)
NEUTROS PCT: 65.6 % (ref 43.0–77.0)
Neutro Abs: 5.3 10*3/uL (ref 1.4–7.7)
PLATELETS: 462 10*3/uL — AB (ref 150.0–400.0)
RBC: 4.02 Mil/uL (ref 3.87–5.11)
RDW: 18.8 % — AB (ref 11.5–15.5)
WBC: 8.1 10*3/uL (ref 4.0–10.5)

## 2017-12-16 LAB — LIPID PANEL
Cholesterol: 214 mg/dL — ABNORMAL HIGH (ref 0–200)
HDL: 52.4 mg/dL (ref >39.00)
LDL Cholesterol: 128 mg/dL — ABNORMAL HIGH (ref 0–99)
NONHDL: 161.71
Total CHOL/HDL Ratio: 4
Triglycerides: 167 mg/dL — ABNORMAL HIGH (ref 0.0–149.0)
VLDL: 33.4 mg/dL (ref 0.0–40.0)

## 2017-12-16 LAB — C. TRACHOMATIS/N. GONORRHOEAE RNA
C. trachomatis RNA, TMA: NOT DETECTED
N. GONORRHOEAE RNA, TMA: NOT DETECTED

## 2017-12-16 LAB — HEMOGLOBIN A1C: Hgb A1c MFr Bld: 6.1 % (ref 4.6–6.5)

## 2017-12-19 LAB — HIV ANTIBODY (ROUTINE TESTING W REFLEX): HIV 1&2 Ab, 4th Generation: NONREACTIVE

## 2017-12-19 LAB — FLUORESCENT TREPONEMAL AB(FTA)-IGG-BLD: FLUORESCENT TREPONEMAL ABS: NONREACTIVE

## 2017-12-19 LAB — RPR TITER: RPR Titer: 1:2 {titer} — ABNORMAL HIGH

## 2017-12-19 LAB — RPR: RPR Ser Ql: REACTIVE — AB

## 2020-01-21 ENCOUNTER — Telehealth: Payer: Self-pay | Admitting: Family Medicine

## 2020-01-21 NOTE — Telephone Encounter (Signed)
Ok with me 

## 2020-01-21 NOTE — Telephone Encounter (Signed)
Pt would like to transfer care from Dr. Volanda Napoleon to Dr. Ethlyn Gallery. Is this okay? Thanks

## 2020-01-22 NOTE — Telephone Encounter (Signed)
ok 

## 2020-01-24 NOTE — Telephone Encounter (Signed)
Left a message for patient to schedule a toc from Dr. Volanda Napoleon to Dr. Ethlyn Gallery.

## 2020-03-28 DIAGNOSIS — N760 Acute vaginitis: Secondary | ICD-10-CM | POA: Diagnosis not present

## 2020-03-28 DIAGNOSIS — B9689 Other specified bacterial agents as the cause of diseases classified elsewhere: Secondary | ICD-10-CM | POA: Diagnosis not present

## 2020-03-28 DIAGNOSIS — B373 Candidiasis of vulva and vagina: Secondary | ICD-10-CM | POA: Diagnosis not present

## 2020-04-02 DIAGNOSIS — H524 Presbyopia: Secondary | ICD-10-CM | POA: Diagnosis not present

## 2020-06-25 ENCOUNTER — Telehealth: Payer: Self-pay | Admitting: Family Medicine

## 2020-06-25 ENCOUNTER — Other Ambulatory Visit: Payer: Self-pay

## 2020-06-25 ENCOUNTER — Encounter: Payer: Self-pay | Admitting: Family Medicine

## 2020-06-25 ENCOUNTER — Other Ambulatory Visit: Payer: Self-pay | Admitting: Family Medicine

## 2020-06-25 ENCOUNTER — Ambulatory Visit: Payer: 59 | Admitting: Family Medicine

## 2020-06-25 VITALS — BP 128/82 | HR 80 | Temp 98.9°F | Ht 64.0 in | Wt 143.5 lb

## 2020-06-25 DIAGNOSIS — Z1159 Encounter for screening for other viral diseases: Secondary | ICD-10-CM | POA: Diagnosis not present

## 2020-06-25 DIAGNOSIS — R5383 Other fatigue: Secondary | ICD-10-CM | POA: Diagnosis not present

## 2020-06-25 DIAGNOSIS — Z13220 Encounter for screening for lipoid disorders: Secondary | ICD-10-CM

## 2020-06-25 DIAGNOSIS — R739 Hyperglycemia, unspecified: Secondary | ICD-10-CM

## 2020-06-25 DIAGNOSIS — D649 Anemia, unspecified: Secondary | ICD-10-CM

## 2020-06-25 DIAGNOSIS — N920 Excessive and frequent menstruation with regular cycle: Secondary | ICD-10-CM | POA: Diagnosis not present

## 2020-06-25 DIAGNOSIS — Z720 Tobacco use: Secondary | ICD-10-CM | POA: Diagnosis not present

## 2020-06-25 MED ORDER — IBUPROFEN 600 MG PO TABS
600.0000 mg | ORAL_TABLET | Freq: Three times a day (TID) | ORAL | 1 refills | Status: DC | PRN
Start: 2020-06-25 — End: 2021-09-08
  Filled 2021-05-14: qty 90, 30d supply, fill #0

## 2020-06-25 MED ORDER — BUPROPION HCL ER (SR) 150 MG PO TB12: ORAL_TABLET | ORAL | 2 refills | Status: DC

## 2020-06-25 NOTE — Telephone Encounter (Signed)
Meds sent

## 2020-06-25 NOTE — Telephone Encounter (Signed)
Pt is calling stated that she forgot to ask during her virtual visit for some Ibuprofen for her heavy menstrual cycle.  Pharm:  [facility] on [address]

## 2020-06-25 NOTE — Progress Notes (Signed)
Crystal Meyer [place] DOB: [birth year] Encounter date: 06/25/2020  This isa 49 y.o. female who presents to establish care. Chief Complaint  Patient presents with  . Establish Care    History of present illness: Last time she was here (2019) - states that she has intense ice eating habit. Has to take iron intermittently due to fatigue. Thinks that because she takes iron intermittently her hemoglobin was stable in 10 area. States that she was anemic as child, was anemic during pregnancy (having to take extra iron). Took some iron on Saturday because feeling lethargic, fatigued, but hasn't taken any in 2.5 weeks. Wanted to see what levels really are.   Has heavy cycles. Cramps badly. Has blood clots. Has always had heavy cycles. Got tubes tied in 2010 and then felt like blood clots started and menses got heavier. They wanted to test her for fibroids, but didn't follow through since on menses at time eval was scheduled. Feels like abdomen area is larger in general. Menstruates for 5 days and heavy for 3. Has to change tampon within 2 hours or she will leak. Wearing super plus tampon.   Interested in A1C; wants to know where she is at for sugars.   Eating ice all the time has messed up her teeth.   Smoking 1/2 PPD. Quit cold [place] when pregnant. Hasn't been able to quit since.    Past Medical History:  Diagnosis Date  . Abnormal Pap smear    age 77, colpo and cryo  . Chlamydia   . Gonorrhea   . Syphilis    Past Surgical History:  Procedure Laterality Date  . CESAREAN SECTION  2002   1997, 2002  . INDUCED ABORTION     x2  . TUBAL LIGATION  2002   Allergies  Allergen Reactions  . Aspirin     Childhood reaction- dehydrated gave too many baby aspirins  . Hydrocodone    No outpatient medications have been marked as taking for the 06/25/20 encounter (Office Visit) with Caren Macadam, MD.   Social History   Tobacco Use  . Smoking status: Current Every Day Smoker    Packs/day:  0.50    Types: Cigarettes  . Smokeless tobacco: Never Used  Substance Use Topics  . Alcohol use: Yes    Comment: occasional   Family History  Problem Relation Age of Onset  . Diabetes Father   . Hypertension Father   . Healthy Sister   . High blood pressure Brother   . Lupus Mother   . Sickle cell trait Mother   . Stomach cancer Maternal Grandmother   . Diabetes Paternal Grandmother   . High blood pressure Paternal Grandmother   . Blindness Paternal Grandmother        secondary to diabetes  . Anesthesia problems Neg Hx      Review of Systems  Constitutional: Positive for fatigue. Negative for chills and fever.  Respiratory: Negative for cough, chest tightness, shortness of breath and wheezing.   Cardiovascular: Negative for chest pain, palpitations and leg swelling.  Genitourinary: Positive for menstrual problem and vaginal bleeding.    Objective:  BP 128/82 (BP Location: Left Arm, Patient Position: Sitting, Cuff Size: Normal)   Pulse 80   Temp 98.9 F (37.2 C) (Oral)   Ht 5\' 4"  (1.626 m)   Wt 143 lb 8 oz (65.1 kg)   LMP 06/05/2020 (Exact Date)   BMI 24.63 kg/m   Weight: 143 lb 8 oz (65.1 kg)   BP  Readings from Last 3 Encounters:  06/25/20 128/82  12/15/17 112/82  05/08/15 123/81   Wt Readings from Last 3 Encounters:  06/25/20 143 lb 8 oz (65.1 kg)  12/15/17 143 lb 8 oz (65.1 kg)  12/20/12 118 lb 3.2 oz (53.6 kg)    Physical Exam Constitutional:      General: She is not in acute distress.    Appearance: She is well-developed.  Eyes:     Comments: Pale conjunctiva  Neck:     Thyroid: No thyroid mass or thyroid tenderness.  Cardiovascular:     Rate and Rhythm: Normal rate and regular rhythm.     Heart sounds: Normal heart sounds. No murmur heard.  No friction rub.  Pulmonary:     Effort: Pulmonary effort is normal. No respiratory distress.     Breath sounds: Normal breath sounds. No wheezing or rales.  Abdominal:     General: Abdomen is flat. Bowel  sounds are normal. There is no distension.     Tenderness: There is abdominal tenderness (suprapubic, slight). There is no guarding.  Musculoskeletal:     Right lower leg: No edema.     Left lower leg: No edema.  Lymphadenopathy:     Cervical: No cervical adenopathy.  Neurological:     Mental Status: She is alert and oriented to person, place, and time.  Psychiatric:        Behavior: Behavior normal.     Assessment/Plan:  1. Tobacco abuse Patient desires to quit smoking.  We discussed different options for this today, and she would like to try Wellbutrin.  We will have her take as directed and she will let me know if any problems with medication. - buPROPion (WELLBUTRIN SR) 150 MG 12 hr tablet; Start with 1 tablet daily x 7 days and then increase to twice daily  Dispense: 60 tablet; Refill: 2  2. Menorrhagia with regular cycle We are going to start with blood work today, will also send for an ultrasound she has been told previously she had fibroids but has not had further assessment.  Follow-up for physical with Pap smear in the next few months. - CBC with Differential/Platelet; Future - TSH; Future - Iron, TIBC and Ferritin Panel; Future - US Pelvic Complete With Transvaginal; Future  3. Anemia, unspecified type We will recheck blood work today, including more detailed lab evaluation due to ongoing symptoms.  4. Hyperglycemia She does follow some a keto diet.  Will recheck A1c. - Comprehensive metabolic panel; Future - Hemoglobin A1c; Future  5. Fatigue, unspecified type See above.  Further investigation pending lab results. - Vitamin B12; Future - VITAMIN D 25 Hydroxy (Vit-D Deficiency, Fractures); Future  6. Lipid screening - Lipid panel; Future  7. Need for hepatitis C screening test - Hepatitis C antibody; Future  Return in about 1 month (around 07/26/2020) for physical exam.  Micheline Rough, MD

## 2020-06-25 NOTE — Patient Instructions (Signed)
[phone] QUIT NOW   

## 2020-06-26 LAB — CBC WITH DIFFERENTIAL/PLATELET
Absolute Monocytes: 460 cells/uL (ref 200–950)
Basophils Absolute: 101 cells/uL (ref 0–200)
Basophils Relative: 1.1 %
Eosinophils Absolute: 230 cells/uL (ref 15–500)
Eosinophils Relative: 2.5 %
HCT: 29.6 % — ABNORMAL LOW (ref 35.0–45.0)
Hemoglobin: 8.6 g/dL — ABNORMAL LOW (ref 11.7–15.5)
Lymphs Abs: 1978 cells/uL (ref 850–3900)
MCH: 21.1 pg — ABNORMAL LOW (ref 27.0–33.0)
MCHC: 29.1 g/dL — ABNORMAL LOW (ref 32.0–36.0)
MCV: 72.7 fL — ABNORMAL LOW (ref 80.0–100.0)
MPV: 9.9 fL (ref 7.5–12.5)
Monocytes Relative: 5 %
Neutro Abs: 6431 cells/uL (ref 1500–7800)
Neutrophils Relative %: 69.9 %
Platelets: 325 10*3/uL (ref 140–400)
RBC: 4.07 10*6/uL (ref 3.80–5.10)
RDW: 17.1 % — ABNORMAL HIGH (ref 11.0–15.0)
Total Lymphocyte: 21.5 %
WBC: 9.2 10*3/uL (ref 3.8–10.8)

## 2020-06-26 LAB — COMPREHENSIVE METABOLIC PANEL
AG Ratio: 1.3 (calc) (ref 1.0–2.5)
ALT: 12 U/L (ref 6–29)
AST: 20 U/L (ref 10–35)
Albumin: 4.2 g/dL (ref 3.6–5.1)
Alkaline phosphatase (APISO): 71 U/L (ref 31–125)
BUN: 15 mg/dL (ref 7–25)
CO2: 21 mmol/L (ref 20–32)
Calcium: 9.5 mg/dL (ref 8.6–10.2)
Chloride: 107 mmol/L (ref 98–110)
Creat: 0.7 mg/dL (ref 0.50–1.10)
Globulin: 3.3 g/dL (calc) (ref 1.9–3.7)
Glucose, Bld: 82 mg/dL (ref 65–99)
Potassium: 4.7 mmol/L (ref 3.5–5.3)
Sodium: 137 mmol/L (ref 135–146)
Total Bilirubin: 0.2 mg/dL (ref 0.2–1.2)
Total Protein: 7.5 g/dL (ref 6.1–8.1)

## 2020-06-26 LAB — HEPATITIS C ANTIBODY
Hepatitis C Ab: NONREACTIVE
SIGNAL TO CUT-OFF: 0.02 (ref <1.00)

## 2020-06-26 LAB — HEMOGLOBIN A1C
Hgb A1c MFr Bld: 5.7 % of total Hgb — ABNORMAL HIGH (ref <5.7)
Mean Plasma Glucose: 117 (calc)
eAG (mmol/L): 6.5 (calc)

## 2020-06-26 LAB — LIPID PANEL
Cholesterol: 220 mg/dL — ABNORMAL HIGH (ref <200)
HDL: 61 mg/dL (ref >=50)
LDL Cholesterol (Calc): 137 mg/dL (calc) — ABNORMAL HIGH
Non-HDL Cholesterol (Calc): 159 mg/dL (calc) — ABNORMAL HIGH (ref <130)
Total CHOL/HDL Ratio: 3.6 (calc) (ref <5.0)
Triglycerides: 117 mg/dL (ref <150)

## 2020-06-26 LAB — TSH: TSH: 0.86 mIU/L

## 2020-06-26 LAB — IRON,TIBC AND FERRITIN PANEL
%SAT: 1 % (calc) — ABNORMAL LOW (ref 16–45)
Ferritin: 3 ng/mL — ABNORMAL LOW (ref 16–232)
Iron: <10 ug/dL — ABNORMAL LOW (ref 40–190)
TIBC: 359 mcg/dL (calc) (ref 250–450)

## 2020-06-26 LAB — VITAMIN D 25 HYDROXY (VIT D DEFICIENCY, FRACTURES): Vit D, 25-Hydroxy: 8 ng/mL — ABNORMAL LOW (ref 30–100)

## 2020-06-26 LAB — VITAMIN B12: Vitamin B-12: 492 pg/mL (ref 200–1100)

## 2020-06-26 NOTE — Telephone Encounter (Signed)
Noted  

## 2020-06-27 MED ORDER — VITAMIN D (ERGOCALCIFEROL) 1.25 MG (50000 UNIT) PO CAPS
50000.0000 [IU] | ORAL_CAPSULE | ORAL | 1 refills | Status: DC
Start: 2020-06-27 — End: 2020-09-18

## 2020-06-27 MED ORDER — VITAMIN D (ERGOCALCIFEROL) 1.25 MG (50000 UNIT) PO CAPS
50000.0000 [IU] | ORAL_CAPSULE | ORAL | 0 refills | Status: DC
Start: 2020-06-27 — End: 2020-06-27

## 2020-06-27 NOTE — Addendum Note (Signed)
Addended by: Agnes Lawrence on: 06/27/2020 04:13 PM   Modules accepted: Orders

## 2020-06-27 NOTE — Addendum Note (Signed)
Addended by: Agnes Lawrence on: 06/27/2020 04:07 PM   Modules accepted: Orders

## 2020-06-30 ENCOUNTER — Encounter: Payer: Self-pay | Admitting: Family Medicine

## 2020-06-30 ENCOUNTER — Telehealth: Payer: Self-pay | Admitting: Hematology and Oncology

## 2020-06-30 NOTE — Telephone Encounter (Signed)
Received anew hem referral from Dr. Ethlyn Gallery for anemia. Pt has been cld and scheduled to see Dr. Lindi Adie on 8/11 at 345pm. Pt aware to arrive 15 minutes early.

## 2020-07-01 NOTE — Progress Notes (Signed)
[facility] NOTE  Patient Care Team: Caren Macadam, MD as PCP - General (Family Medicine)  CHIEF COMPLAINTS/PURPOSE OF CONSULTATION:  Newly diagnosed iron deficiency anemia  HISTORY OF PRESENTING ILLNESS:  Crystal Meyer 49 y.o. female is here because of recent diagnosis of iron deficiency anemia. She is referred by Dr. Ethlyn Gallery. Labs on 06/25/20 showed Hg 8.6, HCT 29.6, platelets 325, B-12 492, iron saturation 1%, ferritin 3. She presents to the clinic today for initial evaluation. She has had heavy menstrual bleeds and has been having symptoms of iron def (pica for ice) for the past 6 years (she has dental damage from long standing ice consumption). 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  I reviewed her records extensively and collaborated the history with the patient.  MEDICAL HISTORY:  Past Medical History:  Diagnosis Date  . Abnormal Pap smear    age 39, colpo and cryo  . Chlamydia   . Gonorrhea   . Syphilis     SURGICAL HISTORY: Past Surgical History:  Procedure Laterality Date  . CESAREAN SECTION  2002   1997, 2002  . INDUCED ABORTION     x2  . TUBAL LIGATION  2002    SOCIAL HISTORY: Social History   Socioeconomic History  . Marital status: Single    Spouse name: Not on file  . Number of children: 2  . Years of education: Not on file  . Highest education level: Not on file  Occupational History  . Not on file  Tobacco Use  . Smoking status: Current Every Day Smoker    Packs/day: 0.50    Types: Cigarettes  . Smokeless tobacco: Never Used  Vaping Use  . Vaping Use: Some days  Substance and Sexual Activity  . Alcohol use: Yes    Comment: occasional  . Drug use: No  . Sexual activity: Yes    Birth control/protection: Surgical  Other Topics Concern  . Not on file  Social History Narrative  . Not on file   Social Determinants of Health   Financial Resource Strain:   . Difficulty of Paying Living Expenses:   Food  Insecurity:   . Worried About Charity fundraiser in the Last Year:   . Arboriculturist in the Last Year:   Transportation Needs:   . Film/video editor (Medical):   Marland Kitchen Lack of Transportation (Non-Medical):   Physical Activity:   . Days of Exercise per Week:   . Minutes of Exercise per Session:   Stress:   . Feeling of Stress :   Social Connections:   . Frequency of Communication with Friends and Family:   . Frequency of Social Gatherings with Friends and Family:   . Attends Religious Services:   . Active Member of Clubs or Organizations:   . Attends Archivist Meetings:   Marland Kitchen Marital Status:   Intimate Partner Violence:   . Fear of Current or Ex-Partner:   . Emotionally Abused:   Marland Kitchen Physically Abused:   . Sexually Abused:     FAMILY HISTORY: Family History  Problem Relation Age of Onset  . Diabetes Father   . Hypertension Father   . Healthy Sister   . High blood pressure Brother   . Lupus Mother   . Sickle cell trait Mother   . Stomach cancer Maternal Grandmother   . Diabetes Paternal Grandmother   . High blood pressure Paternal Grandmother   . Blindness Paternal Grandmother  secondary to diabetes  . Anesthesia problems Neg Hx     ALLERGIES:  is allergic to aspirin and hydrocodone.  MEDICATIONS:  Current Outpatient Medications  Medication Sig Dispense Refill  . buPROPion (WELLBUTRIN SR) 150 MG 12 hr tablet Start with 1 tablet daily x 7 days and then increase to twice daily 60 tablet 2  . hydrocortisone cream 1 % Apply 1 application topically 2 (two) times daily as needed for itching. For eczema    . ibuprofen (ADVIL) 600 MG tablet Take 1 tablet (600 mg total) by mouth every 8 (eight) hours as needed. 90 tablet 1  . Vitamin D, Ergocalciferol, (DRISDOL) 1.25 MG (50000 UNIT) CAPS capsule Take 1 capsule (50,000 Units total) by mouth every 7 (seven) days. 12 capsule 1   No current facility-administered medications for this visit.    REVIEW OF  SYSTEMS:   As above all other systems are neg  PHYSICAL EXAMINATION: ECOG PERFORMANCE STATUS: 1 - Symptomatic but completely ambulatory  Vitals:   07/02/20 1600  BP: 126/89  Pulse: 89  Resp: 17  Temp: 98.4 F (36.9 C)  SpO2: 100%   Filed Weights   07/02/20 1600  Weight: 141 lb (64 kg)     LABORATORY DATA:  I have reviewed the data as listed Lab Results  Component Value Date   WBC 9.2 06/25/2020   HGB 8.6 (L) 06/25/2020   HCT 29.6 (L) 06/25/2020   MCV 72.7 (L) 06/25/2020   PLT 325 06/25/2020   Lab Results  Component Value Date   NA 137 06/25/2020   K 4.7 06/25/2020   CL 107 06/25/2020   CO2 21 06/25/2020    RADIOGRAPHIC STUDIES: I have personally reviewed the radiological reports and agreed with the findings in the report.  ASSESSMENT AND PLAN:  Iron deficiency anemia Secondary to heavy menstrual losses 06/25/2020: Iron saturation 1%, ferritin 3, hemoglobin 8.6, MCV 72.7, RDW 17.1 Recommendation: 1.  IV iron 2. GYN evaluation to stop the heavy menstrual bleeding  Return to clinic in 6 weeks for labs and follow-up I suspect that she will need additional IV iron infusions in the future because of extent and the severity of her menstrual losses   All questions were answered. The patient knows to call the clinic with any problems, questions or concerns.   Rulon Eisenmenger, MD, MPH 07/02/2020    I, Molly Dorshimer, am acting as scribe for Nicholas Lose, MD.  I have reviewed the above documentation for accuracy and completeness, and I agree with the above.

## 2020-07-02 ENCOUNTER — Other Ambulatory Visit: Payer: Self-pay

## 2020-07-02 ENCOUNTER — Inpatient Hospital Stay: Payer: 59 | Attending: Hematology and Oncology | Admitting: Hematology and Oncology

## 2020-07-02 DIAGNOSIS — F17210 Nicotine dependence, cigarettes, uncomplicated: Secondary | ICD-10-CM | POA: Diagnosis not present

## 2020-07-02 DIAGNOSIS — N920 Excessive and frequent menstruation with regular cycle: Secondary | ICD-10-CM | POA: Diagnosis not present

## 2020-07-02 DIAGNOSIS — D500 Iron deficiency anemia secondary to blood loss (chronic): Secondary | ICD-10-CM | POA: Diagnosis not present

## 2020-07-02 DIAGNOSIS — D509 Iron deficiency anemia, unspecified: Secondary | ICD-10-CM | POA: Diagnosis not present

## 2020-07-02 NOTE — Assessment & Plan Note (Signed)
Secondary to heavy menstrual losses 06/25/2020: Iron saturation 1%, ferritin 3, hemoglobin 8.6, MCV 72.7, RDW 17.1 Recommendation: 1.  IV iron 2. GYN evaluation to stop the heavy menstrual bleeding  Return to clinic in 6 weeks for labs and follow-up I suspect that she will need additional IV iron infusions in the future because of extent and the severity of her menstrual losses

## 2020-07-02 NOTE — Addendum Note (Signed)
Addended by: Nicholas Lose on: 07/02/2020 04:33 PM   Modules accepted: Orders

## 2020-07-03 ENCOUNTER — Telehealth: Payer: Self-pay | Admitting: Hematology and Oncology

## 2020-07-03 NOTE — Telephone Encounter (Signed)
Scheduled appt per 8/10 los - left message for patient with apt date and time

## 2020-07-04 ENCOUNTER — Inpatient Hospital Stay: Payer: 59

## 2020-07-04 ENCOUNTER — Other Ambulatory Visit: Payer: Self-pay

## 2020-07-04 ENCOUNTER — Telehealth: Payer: Self-pay | Admitting: Hematology and Oncology

## 2020-07-04 VITALS — BP 144/90 | HR 64 | Temp 98.8°F | Resp 18 | Wt 140.0 lb

## 2020-07-04 DIAGNOSIS — D500 Iron deficiency anemia secondary to blood loss (chronic): Secondary | ICD-10-CM

## 2020-07-04 DIAGNOSIS — N920 Excessive and frequent menstruation with regular cycle: Secondary | ICD-10-CM | POA: Diagnosis not present

## 2020-07-04 DIAGNOSIS — F17210 Nicotine dependence, cigarettes, uncomplicated: Secondary | ICD-10-CM | POA: Diagnosis not present

## 2020-07-04 DIAGNOSIS — D509 Iron deficiency anemia, unspecified: Secondary | ICD-10-CM | POA: Diagnosis not present

## 2020-07-04 MED ORDER — SODIUM CHLORIDE 0.9 % IV SOLN
Freq: Once | INTRAVENOUS | Status: AC
  Filled 2020-07-04: qty 250

## 2020-07-04 MED ORDER — SODIUM CHLORIDE 0.9 % IV SOLN
300.0000 mg | Freq: Once | INTRAVENOUS | Status: AC
  Administered 2020-07-04: 300 mg via INTRAVENOUS
  Filled 2020-07-04: qty 5

## 2020-07-04 NOTE — Patient Instructions (Signed)

## 2020-07-04 NOTE — Telephone Encounter (Signed)
Rescheduled per pt request. Pt request to move appt on 8/17 to 8/18, 8/19 to 8/24. Spoke with RN Merleen Nicely and ok to move. Pt is aware of appt time and date.

## 2020-07-08 ENCOUNTER — Ambulatory Visit: Payer: 59

## 2020-07-09 ENCOUNTER — Inpatient Hospital Stay: Payer: 59

## 2020-07-09 ENCOUNTER — Other Ambulatory Visit: Payer: Self-pay

## 2020-07-09 VITALS — BP 139/95 | HR 63 | Temp 98.5°F | Resp 18

## 2020-07-09 DIAGNOSIS — N920 Excessive and frequent menstruation with regular cycle: Secondary | ICD-10-CM | POA: Diagnosis not present

## 2020-07-09 DIAGNOSIS — D509 Iron deficiency anemia, unspecified: Secondary | ICD-10-CM | POA: Diagnosis not present

## 2020-07-09 DIAGNOSIS — D500 Iron deficiency anemia secondary to blood loss (chronic): Secondary | ICD-10-CM

## 2020-07-09 DIAGNOSIS — F17210 Nicotine dependence, cigarettes, uncomplicated: Secondary | ICD-10-CM | POA: Diagnosis not present

## 2020-07-09 MED ORDER — SODIUM CHLORIDE 0.9 % IV SOLN
300.0000 mg | Freq: Once | INTRAVENOUS | Status: AC
  Administered 2020-07-09: 300 mg via INTRAVENOUS
  Filled 2020-07-09: qty 10

## 2020-07-09 MED ORDER — SODIUM CHLORIDE 0.9 % IV SOLN
Freq: Once | INTRAVENOUS | Status: AC
  Filled 2020-07-09: qty 250

## 2020-07-09 NOTE — Patient Instructions (Signed)

## 2020-07-10 ENCOUNTER — Ambulatory Visit: Payer: 59

## 2020-07-15 ENCOUNTER — Other Ambulatory Visit: Payer: Self-pay

## 2020-07-15 ENCOUNTER — Inpatient Hospital Stay: Payer: 59

## 2020-07-15 VITALS — BP 160/92 | HR 66 | Temp 98.9°F | Resp 18

## 2020-07-15 DIAGNOSIS — N920 Excessive and frequent menstruation with regular cycle: Secondary | ICD-10-CM | POA: Diagnosis not present

## 2020-07-15 DIAGNOSIS — D500 Iron deficiency anemia secondary to blood loss (chronic): Secondary | ICD-10-CM

## 2020-07-15 DIAGNOSIS — F17210 Nicotine dependence, cigarettes, uncomplicated: Secondary | ICD-10-CM | POA: Diagnosis not present

## 2020-07-15 DIAGNOSIS — D509 Iron deficiency anemia, unspecified: Secondary | ICD-10-CM | POA: Diagnosis not present

## 2020-07-15 MED ORDER — SODIUM CHLORIDE 0.9 % IV SOLN
300.0000 mg | Freq: Once | INTRAVENOUS | Status: AC
  Administered 2020-07-15: 300 mg via INTRAVENOUS
  Filled 2020-07-15: qty 10

## 2020-07-15 MED ORDER — SODIUM CHLORIDE 0.9 % IV SOLN
Freq: Once | INTRAVENOUS | Status: AC
  Filled 2020-07-15: qty 250

## 2020-07-15 NOTE — Patient Instructions (Signed)

## 2020-08-13 ENCOUNTER — Inpatient Hospital Stay: Payer: 59 | Attending: Hematology and Oncology

## 2020-08-13 ENCOUNTER — Other Ambulatory Visit: Payer: Self-pay

## 2020-08-13 ENCOUNTER — Other Ambulatory Visit: Payer: Self-pay | Admitting: *Deleted

## 2020-08-13 DIAGNOSIS — D500 Iron deficiency anemia secondary to blood loss (chronic): Secondary | ICD-10-CM | POA: Insufficient documentation

## 2020-08-13 DIAGNOSIS — N920 Excessive and frequent menstruation with regular cycle: Secondary | ICD-10-CM | POA: Insufficient documentation

## 2020-08-13 DIAGNOSIS — D509 Iron deficiency anemia, unspecified: Secondary | ICD-10-CM | POA: Diagnosis present

## 2020-08-13 LAB — CMP (CANCER CENTER ONLY)
ALT: 21 U/L (ref 0–44)
AST: 21 U/L (ref 15–41)
Albumin: 3.5 g/dL (ref 3.5–5.0)
Alkaline Phosphatase: 72 U/L (ref 38–126)
Anion gap: 5 (ref 5–15)
BUN: 13 mg/dL (ref 6–20)
CO2: 29 mmol/L (ref 22–32)
Calcium: 9.6 mg/dL (ref 8.9–10.3)
Chloride: 108 mmol/L (ref 98–111)
Creatinine: 0.72 mg/dL (ref 0.44–1.00)
GFR, Est AFR Am: >60 mL/min (ref >60)
GFR, Estimated: >60 mL/min (ref >60)
Glucose, Bld: 87 mg/dL (ref 70–99)
Potassium: 4.7 mmol/L (ref 3.5–5.1)
Sodium: 142 mmol/L (ref 135–145)
Total Bilirubin: <0.2 mg/dL — ABNORMAL LOW (ref 0.3–1.2)
Total Protein: 7.3 g/dL (ref 6.5–8.1)

## 2020-08-13 LAB — CBC WITH DIFFERENTIAL (CANCER CENTER ONLY)
Abs Immature Granulocytes: 0.04 10*3/uL (ref 0.00–0.07)
Basophils Absolute: 0.1 10*3/uL (ref 0.0–0.1)
Basophils Relative: 1 %
Eosinophils Absolute: 0.3 10*3/uL (ref 0.0–0.5)
Eosinophils Relative: 3 %
HCT: 33.8 % — ABNORMAL LOW (ref 36.0–46.0)
Hemoglobin: 10.7 g/dL — ABNORMAL LOW (ref 12.0–15.0)
Immature Granulocytes: 1 %
Lymphocytes Relative: 24 %
Lymphs Abs: 1.9 10*3/uL (ref 0.7–4.0)
MCH: 26.5 pg (ref 26.0–34.0)
MCHC: 31.7 g/dL (ref 30.0–36.0)
MCV: 83.7 fL (ref 80.0–100.0)
Monocytes Absolute: 0.5 10*3/uL (ref 0.1–1.0)
Monocytes Relative: 7 %
Neutro Abs: 5.2 10*3/uL (ref 1.7–7.7)
Neutrophils Relative %: 64 %
Platelet Count: 331 10*3/uL (ref 150–400)
RBC: 4.04 MIL/uL (ref 3.87–5.11)
WBC Count: 8 10*3/uL (ref 4.0–10.5)
nRBC: 0 % (ref 0.0–0.2)

## 2020-08-13 LAB — IRON AND TIBC
Iron: 54 ug/dL (ref 41–142)
Saturation Ratios: 17 % — ABNORMAL LOW (ref 21–57)
TIBC: 311 ug/dL (ref 236–444)
UIBC: 257 ug/dL (ref 120–384)

## 2020-08-14 ENCOUNTER — Telehealth: Payer: Self-pay | Admitting: Hematology and Oncology

## 2020-08-14 NOTE — Telephone Encounter (Signed)
Left message for patient to verify mychart video visit for pre reg

## 2020-08-14 NOTE — Progress Notes (Signed)
:   [place] VISIT PROGRESS NOTE  I connected with Crystal Meyer on 08/15/2020 at 10:45 AM EDT by MyChart video conference and verified that I am speaking with the correct person using two identifiers.  I discussed the limitations, risks, security and privacy concerns of performing an evaluation and management service by MyChart and the availability of in person appointments.  I also discussed with the patient that there may be a patient responsible charge related to this service. The patient expressed understanding and agreed to proceed.  Patient's Location: Home Physician Location: Clinic  CHIEF COMPLIANT: Follow-up of iron deficiency anemia  INTERVAL HISTORY: Crystal Meyer is a 49 y.o. female with above-mentioned history of iron deficiency anemia. Labs on 06/25/20 showed Hg 10.7, HCT 33.8, platelets 331, iron saturation 17%. She presents over MyChart today for follow-up.   Observations/Objective:  There were no vitals filed for this visit. There is no height or weight on file to calculate BMI.  I have reviewed the data as listed CMP Latest Ref Rng & Units 08/13/2020 06/25/2020 12/15/2017  Glucose 70 - 99 mg/dL 87 82 90  BUN 6 - 20 mg/dL 13 15 11   Creatinine 0.44 - 1.00 mg/dL 0.72 0.70 0.69  Sodium 135 - 145 mmol/L 142 137 136  Potassium 3.5 - 5.1 mmol/L 4.7 4.7 4.5  Chloride 98 - 111 mmol/L 108 107 102  CO2 22 - 32 mmol/L 29 21 25   Calcium 8.9 - 10.3 mg/dL 9.6 9.5 9.4  Total Protein 6.5 - 8.1 g/dL 7.3 7.5 7.1  Total Bilirubin 0.3 - 1.2 mg/dL <0.2(L) 0.2 0.2  Alkaline Phos 38 - 126 U/L 72 - 76  AST 15 - 41 U/L 21 20 23   ALT 0 - 44 U/L 21 12 23     Lab Results  Component Value Date   WBC 8.0 08/13/2020   HGB 10.7 (L) 08/13/2020   HCT 33.8 (L) 08/13/2020   MCV 83.7 08/13/2020   PLT 331 08/13/2020   NEUTROABS 5.2 08/13/2020      Assessment Plan:  Iron deficiency anemia due to chronic blood loss Secondary to heavy menstrual  losses 06/25/2020: Iron saturation 1%, ferritin 3, hemoglobin 8.6, MCV 72.7, RDW 17.1 08/13/2020: Hemoglobin 10.7 (improved from 8.6) MCV 83.7, iron saturation 17%, TIBC 311  IV iron: August 2021 Patient has had significant improvement in her hemoglobin and iron studies. Once again discussed that she needs to see a gynecologist to stop the heavy menstrual bleeding. Patient has an appointment next month to see her gynecologist to discuss transvaginal ultrasound and figure out how she can stop the heavy menstrual bleeding.  Return to clinic in 3 months with labs done ahead of time and follow-up.    I discussed the assessment and treatment plan with the patient. The patient was provided an opportunity to ask questions and all were answered. The patient agreed with the plan and demonstrated an understanding of the instructions. The patient was advised to call back or seek an in-person evaluation if the symptoms worsen or if the condition fails to improve as anticipated.   I provided 20 minutes of face-to-face MyChart video visit time during this encounter.    Rulon Eisenmenger, MD 08/15/2020   I, Molly Dorshimer, am acting as scribe for Nicholas Lose, MD.  I have reviewed the above documentation for accuracy and completeness, and I agree with the above.

## 2020-08-15 ENCOUNTER — Telehealth: Payer: 59 | Admitting: Hematology and Oncology

## 2020-08-15 ENCOUNTER — Inpatient Hospital Stay (HOSPITAL_BASED_OUTPATIENT_CLINIC_OR_DEPARTMENT_OTHER): Payer: 59 | Admitting: Hematology and Oncology

## 2020-08-15 DIAGNOSIS — D500 Iron deficiency anemia secondary to blood loss (chronic): Secondary | ICD-10-CM

## 2020-08-15 NOTE — Assessment & Plan Note (Signed)
Secondary to heavy menstrual losses 06/25/2020: Iron saturation 1%, ferritin 3, hemoglobin 8.6, MCV 72.7, RDW 17.1 08/13/2020: Hemoglobin 10.7 (improved from 8.6) MCV 83.7, iron saturation 17%, TIBC 311  IV iron: August 2021 Patient has had significant improvement in her hemoglobin and iron studies. Once again discussed that she needs to see a gynecologist to stop the heavy menstrual bleeding. Return to clinic in 3 months with labs done ahead of time and follow-up.

## 2020-08-19 ENCOUNTER — Telehealth: Payer: Self-pay | Admitting: Hematology and Oncology

## 2020-08-19 NOTE — Telephone Encounter (Signed)
Scheduled appt per 9/28 los. Left voicemail with appt date and time.

## 2020-09-10 ENCOUNTER — Other Ambulatory Visit: Payer: Self-pay | Admitting: Family Medicine

## 2020-09-10 DIAGNOSIS — Z1231 Encounter for screening mammogram for malignant neoplasm of breast: Secondary | ICD-10-CM

## 2020-09-18 ENCOUNTER — Other Ambulatory Visit: Payer: Self-pay | Admitting: Family Medicine

## 2020-10-08 ENCOUNTER — Other Ambulatory Visit: Payer: Self-pay

## 2020-10-08 ENCOUNTER — Ambulatory Visit
Admission: RE | Admit: 2020-10-08 | Discharge: 2020-10-08 | Disposition: A | Discharge status: Home / Self Care | Payer: 59 | Source: Ambulatory Visit | Attending: Family Medicine | Admitting: Family Medicine

## 2020-10-08 DIAGNOSIS — Z1231 Encounter for screening mammogram for malignant neoplasm of breast: Secondary | ICD-10-CM | POA: Diagnosis not present

## 2020-10-15 ENCOUNTER — Other Ambulatory Visit: Payer: Self-pay | Admitting: Family Medicine

## 2020-10-15 DIAGNOSIS — R928 Other abnormal and inconclusive findings on diagnostic imaging of breast: Secondary | ICD-10-CM

## 2020-10-31 ENCOUNTER — Other Ambulatory Visit (HOSPITAL_COMMUNITY)
Admission: RE | Admit: 2020-10-31 | Discharge: 2020-10-31 | Disposition: A | Discharge status: Home / Self Care | Payer: 59 | Source: Ambulatory Visit | Attending: Family Medicine | Admitting: Family Medicine

## 2020-10-31 ENCOUNTER — Ambulatory Visit (INDEPENDENT_AMBULATORY_CARE_PROVIDER_SITE_OTHER): Payer: 59 | Admitting: Family Medicine

## 2020-10-31 ENCOUNTER — Encounter: Payer: Self-pay | Admitting: Family Medicine

## 2020-10-31 ENCOUNTER — Other Ambulatory Visit: Payer: Self-pay

## 2020-10-31 VITALS — BP 118/80 | HR 101 | Temp 98.6°F | Ht 61.0 in | Wt 148.6 lb

## 2020-10-31 DIAGNOSIS — D500 Iron deficiency anemia secondary to blood loss (chronic): Secondary | ICD-10-CM

## 2020-10-31 DIAGNOSIS — N924 Excessive bleeding in the premenopausal period: Secondary | ICD-10-CM

## 2020-10-31 DIAGNOSIS — R066 Hiccough: Secondary | ICD-10-CM

## 2020-10-31 DIAGNOSIS — Z0001 Encounter for general adult medical examination with abnormal findings: Secondary | ICD-10-CM

## 2020-10-31 DIAGNOSIS — Z0000 Encounter for general adult medical examination without abnormal findings: Secondary | ICD-10-CM

## 2020-10-31 DIAGNOSIS — D509 Iron deficiency anemia, unspecified: Secondary | ICD-10-CM

## 2020-10-31 DIAGNOSIS — Z124 Encounter for screening for malignant neoplasm of cervix: Secondary | ICD-10-CM | POA: Diagnosis not present

## 2020-10-31 NOTE — Progress Notes (Signed)
Crystal Meyer [place] DOB: 1971-10-29 Encounter date: 10/31/2020  This is a 49 y.o. female who presents for complete physical   History of present illness/Additional concerns: Recent mammogram completed 11/17 with abnormalities.  Follow-up imaging recommended. She didn't do the follow up yet for this; wanted to talk with me first before proceeding with this. She feels that left breast is enlarging.   We discussed desire to quit smoking at last visit.  Wellbutrin was started for this. Didn't quit entirely; but cut back to twice daily. Feels that she feels better with this; eating more though.   Iron deficiency anemia: She is following with hematology for this.  She also had set up appointment with gynecology to discuss heavy menstrual bleeding. She did not have this visit; not sure if needed referral.  I had previously ordered transvaginal ultrasound, but this was not completed.  Periods still regular, last 5 days, heavy flow, blood clots. She is feeling better with her iron levels corrected. Ice cravings stopped immediately.   Past Medical History:  Diagnosis Date  . Abnormal Pap smear    age 75, colpo and cryo  . Chlamydia   . Gonorrhea   . Syphilis    Past Surgical History:  Procedure Laterality Date  . CESAREAN SECTION  2002   1997, 2002  . INDUCED ABORTION     x2  . TUBAL LIGATION  2002   Allergies  Allergen Reactions  . Aspirin     Childhood reaction- dehydrated gave too many baby aspirins  . Hydrocodone    Current Meds  Medication Sig  . buPROPion (WELLBUTRIN SR) 150 MG 12 hr tablet Start with 1 tablet daily x 7 days and then increase to twice daily  . hydrocortisone cream 1 % Apply 1 application topically 2 (two) times daily as needed for itching. For eczema  . ibuprofen (ADVIL) 600 MG tablet Take 1 tablet (600 mg total) by mouth every 8 (eight) hours as needed.  . Vitamin D, Ergocalciferol, (DRISDOL) 1.25 MG (50000 UNIT) CAPS capsule TAKE 1 CAPSULE BY MOUTH EVERY 7 DAYS    Social History   Tobacco Use  . Smoking status: Current Every Day Smoker    Packs/day: 0.50    Types: Cigarettes  . Smokeless tobacco: Never Used  Substance Use Topics  . Alcohol use: Yes    Comment: occasional   Family History  Problem Relation Age of Onset  . Diabetes Father   . Hypertension Father   . Healthy Sister   . High blood pressure Brother   . Lupus Mother   . Sickle cell trait Mother   . Stomach cancer Maternal Grandmother   . Diabetes Paternal Grandmother   . High blood pressure Paternal Grandmother   . Blindness Paternal Grandmother        secondary to diabetes  . Anesthesia problems Neg Hx      Review of Systems  Constitutional: Negative for activity change, appetite change, chills, fatigue, fever and unexpected weight change.  HENT: Negative for congestion, ear pain, hearing loss, sinus pressure, sinus pain, sore throat and trouble swallowing.   Eyes: Negative for pain and visual disturbance.  Respiratory: Negative for cough, chest tightness, shortness of breath and wheezing.   Cardiovascular: Negative for chest pain, palpitations and leg swelling.  Gastrointestinal: Negative for abdominal pain, blood in stool, constipation, diarrhea, nausea and vomiting.  Genitourinary: Positive for menstrual problem (heavy menses, cramping, clots, see hpi). Negative for difficulty urinating.  Musculoskeletal: Negative for arthralgias and back pain.  Skin: Negative for rash.  Neurological: Negative for dizziness, weakness, numbness and headaches.  Hematological: Negative for adenopathy. Does not bruise/bleed easily.  Psychiatric/Behavioral: Negative for sleep disturbance and suicidal ideas. The patient is not nervous/anxious.     CBC:  Lab Results  Component Value Date   WBC 8.0 08/13/2020   WBC 9.2 06/25/2020   HGB 10.7 (L) 08/13/2020   HCT 33.8 (L) 08/13/2020   MCH 26.5 08/13/2020   MCHC 31.7 08/13/2020   RDW Not Measured 08/13/2020   PLT 331 08/13/2020    MPV 9.9 06/25/2020   CMP: Lab Results  Component Value Date   NA 142 08/13/2020   K 4.7 08/13/2020   CL 108 08/13/2020   CO2 29 08/13/2020   ANIONGAP 5 08/13/2020   GLUCOSE 87 08/13/2020   BUN 13 08/13/2020   CREATININE 0.72 08/13/2020   CREATININE 0.70 06/25/2020   GFRAA >60 08/13/2020   CALCIUM 9.6 08/13/2020   PROT 7.3 08/13/2020   BILITOT <0.2 (L) 08/13/2020   ALKPHOS 72 08/13/2020   ALT 21 08/13/2020   AST 21 08/13/2020   LIPID: Lab Results  Component Value Date   CHOL 220 (H) 06/25/2020   TRIG 117 06/25/2020   HDL 61 06/25/2020   LDLCALC 137 (H) 06/25/2020    Objective:  BP 118/80 (BP Location: Right Arm, Patient Position: Sitting, Cuff Size: Normal)   Pulse (!) 101   Temp 98.6 F (37 C) (Oral)   Ht 5\' 1"  (1.549 m)   Wt 148 lb 9.6 oz (67.4 kg)   LMP 10/08/2020   BMI 28.08 kg/m   Weight: 148 lb 9.6 oz (67.4 kg)   BP Readings from Last 3 Encounters:  10/31/20 118/80  07/15/20 (!) 160/92  07/09/20 (!) 139/95   Wt Readings from Last 3 Encounters:  10/31/20 148 lb 9.6 oz (67.4 kg)  07/04/20 140 lb (63.5 kg)  07/02/20 141 lb (64 kg)    Physical Exam Exam conducted with a chaperone present.  Constitutional:      General: She is not in acute distress.    Appearance: She is well-developed and well-nourished.  HENT:     Head: Normocephalic and atraumatic.     Right Ear: External ear normal.     Left Ear: External ear normal.     Mouth/Throat:     Mouth: Oropharynx is clear and moist.     Pharynx: No oropharyngeal exudate.  Eyes:     Conjunctiva/sclera: Conjunctivae normal.     Pupils: Pupils are equal, round, and reactive to light.  Neck:     Thyroid: No thyromegaly.  Cardiovascular:     Rate and Rhythm: Normal rate and regular rhythm.     Heart sounds: Normal heart sounds. No murmur heard. No friction rub. No gallop.   Pulmonary:     Effort: Pulmonary effort is normal.     Breath sounds: Normal breath sounds.  Abdominal:     General: Bowel  sounds are normal. There is no distension.     Palpations: Abdomen is soft. There is no mass.     Tenderness: There is no abdominal tenderness. There is no guarding.     Hernia: No hernia is present.  Genitourinary:    Exam position: Supine.     Vagina: Normal.     Cervix: Normal.     Uterus: Normal.      Adnexa: Right adnexa normal and left adnexa normal.  Musculoskeletal:        General: No tenderness, deformity  or edema. Normal range of motion.     Cervical back: Normal range of motion and neck supple.  Lymphadenopathy:     Cervical: No cervical adenopathy.  Skin:    General: Skin is warm and dry.     Findings: No rash.  Neurological:     Mental Status: She is alert and oriented to person, place, and time.     Deep Tendon Reflexes: Strength normal. Reflexes normal.     Reflex Scores:      Tricep reflexes are 2+ on the right side and 2+ on the left side.      Bicep reflexes are 2+ on the right side and 2+ on the left side.      Brachioradialis reflexes are 2+ on the right side and 2+ on the left side.      Patellar reflexes are 2+ on the right side and 2+ on the left side. Psychiatric:        Mood and Affect: Mood and affect normal.        Speech: Speech normal.        Behavior: Behavior normal.        Thought Content: Thought content normal.     Assessment/Plan: Health Maintenance Due  Topic Date Due  . PAP SMEAR-Modifier  03/07/2010   Health Maintenance reviewed . Call to set up follow up imaging on breast. She states she will do this.   1. Preventative health care Keep up with regular activity level. She is doing great with cutting back tobacco use.  2. Excessive bleeding in premenopausal period Will look into [place] order and try to get this arranged.  - Ambulatory referral to Gynecology  3. Iron deficiency anemia due to chronic blood loss - Ambulatory referral to Gynecology  4. Cervical cancer screening - PAP [[place]]  Return in about 1 year (around  10/31/2021).  Crystal Rough, MD

## 2020-10-31 NOTE — Patient Instructions (Signed)
Let me know if you don't hear from scheduling for ultrasound or from gynecology in next two weeks.

## 2020-11-04 LAB — CYTOLOGY - PAP
Comment: NEGATIVE
Diagnosis: NEGATIVE
High risk HPV: NEGATIVE

## 2020-11-24 ENCOUNTER — Inpatient Hospital Stay: Payer: 59 | Attending: Family Medicine

## 2020-11-26 ENCOUNTER — Telehealth: Payer: 59 | Admitting: Hematology and Oncology

## 2020-12-24 ENCOUNTER — Encounter: Payer: Self-pay | Admitting: *Deleted

## 2020-12-30 ENCOUNTER — Ambulatory Visit
Admission: RE | Admit: 2020-12-30 | Discharge: 2020-12-30 | Disposition: A | Discharge status: Home / Self Care | Payer: 59 | Source: Ambulatory Visit | Attending: Family Medicine | Admitting: Family Medicine

## 2020-12-30 DIAGNOSIS — N920 Excessive and frequent menstruation with regular cycle: Secondary | ICD-10-CM

## 2020-12-30 DIAGNOSIS — D251 Intramural leiomyoma of uterus: Secondary | ICD-10-CM | POA: Diagnosis not present

## 2020-12-31 ENCOUNTER — Other Ambulatory Visit: Payer: Self-pay | Admitting: Family Medicine

## 2020-12-31 ENCOUNTER — Ambulatory Visit
Admission: RE | Admit: 2020-12-31 | Discharge: 2020-12-31 | Disposition: A | Discharge status: Home / Self Care | Payer: 59 | Source: Ambulatory Visit | Attending: Family Medicine | Admitting: Family Medicine

## 2020-12-31 ENCOUNTER — Other Ambulatory Visit: Payer: Self-pay

## 2020-12-31 DIAGNOSIS — R928 Other abnormal and inconclusive findings on diagnostic imaging of breast: Secondary | ICD-10-CM

## 2020-12-31 DIAGNOSIS — R921 Mammographic calcification found on diagnostic imaging of breast: Secondary | ICD-10-CM | POA: Diagnosis not present

## 2020-12-31 DIAGNOSIS — N6011 Diffuse cystic mastopathy of right breast: Secondary | ICD-10-CM | POA: Diagnosis not present

## 2021-01-06 ENCOUNTER — Telehealth: Payer: Self-pay | Admitting: Family Medicine

## 2021-01-06 NOTE — Telephone Encounter (Signed)
Patient is returning a call from [place].  Patient states she will go to sleep after 3 pm today due to working night shift.  Please advise.

## 2021-01-07 NOTE — Telephone Encounter (Signed)
See results note-patient informed. 

## 2021-01-18 ENCOUNTER — Other Ambulatory Visit: Payer: Self-pay | Admitting: Family Medicine

## 2021-05-14 ENCOUNTER — Other Ambulatory Visit (HOSPITAL_COMMUNITY): Payer: Self-pay

## 2021-05-14 ENCOUNTER — Encounter: Payer: Self-pay | Admitting: Hematology and Oncology

## 2021-07-02 ENCOUNTER — Inpatient Hospital Stay: Admission: RE | Admit: 2021-07-02 | Payer: 59 | Source: Ambulatory Visit

## 2021-09-08 ENCOUNTER — Other Ambulatory Visit: Payer: Self-pay | Admitting: Family Medicine

## 2021-09-08 ENCOUNTER — Other Ambulatory Visit (HOSPITAL_COMMUNITY): Payer: Self-pay

## 2021-09-09 ENCOUNTER — Other Ambulatory Visit (HOSPITAL_COMMUNITY): Payer: Self-pay

## 2021-09-09 MED ORDER — IBUPROFEN 600 MG PO TABS
600.0000 mg | ORAL_TABLET | Freq: Three times a day (TID) | ORAL | 1 refills | Status: AC | PRN
  Filled 2021-09-09: qty 90, 30d supply, fill #0

## 2021-10-14 ENCOUNTER — Encounter: Payer: Self-pay | Admitting: Family Medicine

## 2022-02-01 ENCOUNTER — Encounter: Payer: Self-pay | Admitting: Family Medicine

## 2022-02-01 ENCOUNTER — Ambulatory Visit (INDEPENDENT_AMBULATORY_CARE_PROVIDER_SITE_OTHER): Payer: 59 | Admitting: Family Medicine

## 2022-02-01 ENCOUNTER — Other Ambulatory Visit: Payer: Self-pay

## 2022-02-01 VITALS — BP 118/80 | HR 100 | Temp 98.3°F | Ht 61.0 in | Wt 148.9 lb

## 2022-02-01 DIAGNOSIS — F17200 Nicotine dependence, unspecified, uncomplicated: Secondary | ICD-10-CM | POA: Diagnosis not present

## 2022-02-01 DIAGNOSIS — R739 Hyperglycemia, unspecified: Secondary | ICD-10-CM

## 2022-02-01 DIAGNOSIS — Z1211 Encounter for screening for malignant neoplasm of colon: Secondary | ICD-10-CM

## 2022-02-01 DIAGNOSIS — N83202 Unspecified ovarian cyst, left side: Secondary | ICD-10-CM

## 2022-02-01 DIAGNOSIS — N920 Excessive and frequent menstruation with regular cycle: Secondary | ICD-10-CM | POA: Diagnosis not present

## 2022-02-01 DIAGNOSIS — Z13220 Encounter for screening for lipoid disorders: Secondary | ICD-10-CM | POA: Diagnosis not present

## 2022-02-01 DIAGNOSIS — D259 Leiomyoma of uterus, unspecified: Secondary | ICD-10-CM | POA: Diagnosis not present

## 2022-02-01 DIAGNOSIS — Z0000 Encounter for general adult medical examination without abnormal findings: Secondary | ICD-10-CM | POA: Diagnosis not present

## 2022-02-01 DIAGNOSIS — R921 Mammographic calcification found on diagnostic imaging of breast: Secondary | ICD-10-CM

## 2022-02-01 DIAGNOSIS — D500 Iron deficiency anemia secondary to blood loss (chronic): Secondary | ICD-10-CM

## 2022-02-01 LAB — CBC WITH DIFFERENTIAL/PLATELET
Basophils Absolute: 0.1 10*3/uL (ref 0.0–0.1)
Basophils Relative: 1.3 % (ref 0.0–3.0)
Eosinophils Absolute: 0.2 10*3/uL (ref 0.0–0.7)
Eosinophils Relative: 2 % (ref 0.0–5.0)
HCT: 34.6 % — ABNORMAL LOW (ref 36.0–46.0)
Hemoglobin: 10.7 g/dL — ABNORMAL LOW (ref 12.0–15.0)
Lymphocytes Relative: 18.6 % (ref 12.0–46.0)
Lymphs Abs: 1.4 10*3/uL (ref 0.7–4.0)
MCHC: 30.8 g/dL (ref 30.0–36.0)
MCV: 77.5 fl — ABNORMAL LOW (ref 78.0–100.0)
Monocytes Absolute: 0.4 10*3/uL (ref 0.1–1.0)
Monocytes Relative: 5.5 % (ref 3.0–12.0)
Neutro Abs: 5.6 10*3/uL (ref 1.4–7.7)
Neutrophils Relative %: 72.6 % (ref 43.0–77.0)
Platelets: 401 10*3/uL — ABNORMAL HIGH (ref 150.0–400.0)
RBC: 4.46 Mil/uL (ref 3.87–5.11)
RDW: 26.4 % — ABNORMAL HIGH (ref 11.5–15.5)
WBC: 7.7 10*3/uL (ref 4.0–10.5)

## 2022-02-01 LAB — LIPID PANEL
Cholesterol: 187 mg/dL (ref 0–200)
HDL: 60.7 mg/dL (ref >39.00)
LDL Cholesterol: 97 mg/dL (ref 0–99)
NonHDL: 126.52
Total CHOL/HDL Ratio: 3
Triglycerides: 148 mg/dL (ref 0.0–149.0)
VLDL: 29.6 mg/dL (ref 0.0–40.0)

## 2022-02-01 LAB — COMPREHENSIVE METABOLIC PANEL
ALT: 21 U/L (ref 0–35)
AST: 21 U/L (ref 0–37)
Albumin: 4.3 g/dL (ref 3.5–5.2)
Alkaline Phosphatase: 69 U/L (ref 39–117)
BUN: 13 mg/dL (ref 6–23)
CO2: 22 mEq/L (ref 19–32)
Calcium: 9.9 mg/dL (ref 8.4–10.5)
Chloride: 104 mEq/L (ref 96–112)
Creatinine, Ser: 0.73 mg/dL (ref 0.40–1.20)
GFR: 97.17 mL/min (ref >60.00)
Glucose, Bld: 80 mg/dL (ref 70–99)
Potassium: 4 mEq/L (ref 3.5–5.1)
Sodium: 136 mEq/L (ref 135–145)
Total Bilirubin: 0.2 mg/dL (ref 0.2–1.2)
Total Protein: 7.5 g/dL (ref 6.0–8.3)

## 2022-02-01 LAB — HEMOGLOBIN A1C: Hgb A1c MFr Bld: 5.2 % (ref 4.6–6.5)

## 2022-02-01 LAB — FERRITIN: Ferritin: 25.9 ng/mL (ref 10.0–291.0)

## 2022-02-01 NOTE — Progress Notes (Signed)
Crystal Meyer [place] DOB: February 20, 1971 Encounter date: 02/01/2022  This is a 51 y.o. female who presents for complete physical   History of present illness/Additional concerns: Last visit with me was 10/2020 for complete physical.  At that time she was referred to gynecology for menorrhagia, anemia.  Pap smear completed by me was negative HPV normal Pap.  Repeat in 5 years.  Pelvic ultrasound was completed on 12/30/2020.  This revealed 2 fibroids, endometrial stripe 12.1 mm, 1 cm complex 6 of the left ovary. She didn't follow up with gyn due to worry with doctors bills. Insurance pays a portion, but she has a lot of out of pocket expense. Last month had cycle twice in feb. 5 days. After cycle was having pink discharge and that lasted 4-5 days after.   Last mammogram 12/31/2020 follow-up ultrasound is suggested to have a 69-monthfollow-up for right breast calcifications.  I do not see that this was completed.  She is a nChartered certified accountant   She has been taking slow release iron. Feels like she is doing well with this. Was taking liquid supplement but it was less iron. Does give her diarrhea sometimes. Will cut back for a day or two when this happens.    We prescribed wellbutrin for her to help her stop smoking. She got worried taking it becase pharmacy kept checking in on her and she worried why.   Doesn't smoke during 12 hour shift, but in habit of smoking after she gets off. Feels like this is hardest one for her.   Dad passed last march from colon cancer. Had polyp in difficult place to operate on.    Past Medical History:  Diagnosis Date   Abnormal Pap smear    age 51 c39and cryo   Chlamydia    Gonorrhea    Syphilis    Past Surgical History:  Procedure Laterality Date   CESAREAN SECTION  2002   1997, 2002   INDUCED ABORTION     x2   TUBAL LIGATION  2002   Allergies  Allergen Reactions   Aspirin     Childhood reaction- dehydrated gave too many baby aspirins   Hydrocodone    Current  Meds  Medication Sig   hydrocortisone cream 1 % Apply 1 application topically 2 (two) times daily as needed for itching. For eczema   ibuprofen (ADVIL) 600 MG tablet Take 1 tablet (600 mg total) by mouth every 8 (eight) hours as needed.   Social History   Tobacco Use   Smoking status: Every Day    Packs/day: 0.50    Types: Cigarettes   Smokeless tobacco: Never  Substance Use Topics   Alcohol use: Yes    Comment: occasional   Family History  Problem Relation Age of Onset   Lupus Mother    Sickle cell trait Mother    Diabetes Father    Hypertension Father    Colon cancer Father [phone]  Healthy Sister    High blood pressure Brother    Stomach cancer Maternal Grandmother    Diabetes Paternal Grandmother    High blood pressure Paternal Grandmother    Blindness Paternal Grandmother        secondary to diabetes   Anesthesia problems Neg Hx      Review of Systems  Constitutional:  Negative for activity change, appetite change, chills, fatigue, fever and unexpected weight change.  HENT:  Negative for congestion, ear pain, hearing loss, sinus pressure, sinus pain, sore throat and trouble  swallowing.   Eyes:  Negative for pain and visual disturbance.  Respiratory:  Negative for cough, chest tightness, shortness of breath and wheezing.   Cardiovascular:  Negative for chest pain, palpitations and leg swelling.  Gastrointestinal:  Negative for abdominal pain, blood in stool, constipation, diarrhea, nausea and vomiting.  Genitourinary:  Negative for difficulty urinating and menstrual problem.  Musculoskeletal:  Negative for arthralgias and back pain.  Skin:  Negative for rash.  Neurological:  Negative for dizziness, weakness, numbness and headaches.  Hematological:  Negative for adenopathy. Does not bruise/bleed easily.  Psychiatric/Behavioral:  Negative for sleep disturbance and suicidal ideas. The patient is not nervous/anxious.    CBC:  Lab Results  Component Value Date   WBC 7.7  02/01/2022   HGB 10.7 (L) 02/01/2022   HGB 10.7 (L) 08/13/2020   HCT 34.6 (L) 02/01/2022   MCH 26.5 08/13/2020   MCHC 30.8 02/01/2022   RDW 26.4 (H) 02/01/2022   PLT 401.0 (H) 02/01/2022   PLT 331 08/13/2020   MPV 9.9 06/25/2020   CMP: Lab Results  Component Value Date   NA 136 02/01/2022   K 4.0 02/01/2022   CL 104 02/01/2022   CO2 22 02/01/2022   ANIONGAP 5 08/13/2020   GLUCOSE 80 02/01/2022   BUN 13 02/01/2022   CREATININE 0.73 02/01/2022   CREATININE 0.72 08/13/2020   CREATININE 0.70 06/25/2020   GFRAA >60 08/13/2020   CALCIUM 9.9 02/01/2022   PROT 7.5 02/01/2022   BILITOT 0.2 02/01/2022   BILITOT <0.2 (L) 08/13/2020   ALKPHOS 69 02/01/2022   ALT 21 02/01/2022   ALT 21 08/13/2020   AST 21 02/01/2022   AST 21 08/13/2020   LIPID: Lab Results  Component Value Date   CHOL 187 02/01/2022   TRIG 148.0 02/01/2022   HDL 60.70 02/01/2022   LDLCALC 97 02/01/2022   LDLCALC 137 (H) 06/25/2020    Objective:  BP 118/80 (BP Location: Left Arm, Patient Position: Sitting, Cuff Size: Normal)    Pulse 100    Temp 98.3 F (36.8 C) (Oral)    Ht '5\' 1"'$  (1.549 m)    Wt 148 lb 14.4 oz (67.5 kg)    LMP 01/17/2022 (Exact Date)    SpO2 98%    BMI 28.13 kg/m   Weight: 148 lb 14.4 oz (67.5 kg)   BP Readings from Last 3 Encounters:  02/01/22 118/80  10/31/20 118/80  07/15/20 (!) 160/92   Wt Readings from Last 3 Encounters:  02/01/22 148 lb 14.4 oz (67.5 kg)  10/31/20 148 lb 9.6 oz (67.4 kg)  07/04/20 140 lb (63.5 kg)    Physical Exam Constitutional:      General: She is not in acute distress.    Appearance: She is well-developed.  HENT:     Head: Normocephalic and atraumatic.     Right Ear: External ear normal.     Left Ear: External ear normal.     Mouth/Throat:     Pharynx: No oropharyngeal exudate.  Eyes:     Conjunctiva/sclera: Conjunctivae normal.     Pupils: Pupils are equal, round, and reactive to light.  Neck:     Thyroid: No thyromegaly.  Cardiovascular:      Rate and Rhythm: Normal rate and regular rhythm.     Heart sounds: Normal heart sounds. No murmur heard.   No friction rub. No gallop.  Pulmonary:     Effort: Pulmonary effort is normal.     Breath sounds: Normal breath sounds.  Abdominal:  General: Bowel sounds are normal. There is no distension.     Palpations: Abdomen is soft. There is no mass.     Tenderness: There is no abdominal tenderness. There is no guarding.     Hernia: No hernia is present.  Musculoskeletal:        General: No tenderness or deformity. Normal range of motion.     Cervical back: Normal range of motion and neck supple.  Lymphadenopathy:     Cervical: No cervical adenopathy.  Skin:    General: Skin is warm and dry.     Findings: No rash.  Neurological:     Mental Status: She is alert and oriented to person, place, and time.     Deep Tendon Reflexes: Reflexes normal.     Reflex Scores:      Tricep reflexes are 2+ on the right side and 2+ on the left side.      Bicep reflexes are 2+ on the right side and 2+ on the left side.      Brachioradialis reflexes are 2+ on the right side and 2+ on the left side.      Patellar reflexes are 2+ on the right side and 2+ on the left side. Psychiatric:        Speech: Speech normal.        Behavior: Behavior normal.        Thought Content: Thought content normal.    Assessment/Plan: Health Maintenance Due  Topic Date Due   COLONOSCOPY (Pts 45-78yr Insurance coverage will need to be confirmed)  Never done   Health Maintenance reviewed - patient asked to schedule her mammogram, mammogram ordered, patient to schedule appointment. Referred for colonoscopy.    1. Preventative health care Consider quitting smoking. Referred for colonoscopy. Needs to follow up on abnormal mammogram and vaginal [place]  2. Hyperglycemia Recheck bloodwork. Sugar has been stable.   3. Iron deficiency anemia due to chronic blood loss Taking slow release iron regularly.   4. Menorrhagia  with regular cycle Encouraged follow up with gyn. Needs follow up on cyst, fibroids/menorrhagia. I will get her assistance numbers for financial help with healthcare.  5. Cyst of left ovary See above.   6. Uterine leiomyoma, unspecified location See above  7. TOBACCO USER Encouraged her to reconsider quitting; she will work on tapering back.  We discussed finding her wife for wanting to quit.  We discussed health benefits of quitting.  8. Breast calcification, right Follow up mammogram/us ordered.    Return for pending lab or imaging results.  JMicheline Rough MD

## 2022-02-01 NOTE — Patient Instructions (Signed)
*  I referred you to gastroenterology (for colonoscopy) - they will call to set this up ?*I referred you again to gynecology - I really want you to follow up with them to discuss your ovarian cyst as well as your fibroids and heavy menses. ?*work on cutting back on smoking.  ?

## 2022-02-03 ENCOUNTER — Encounter: Payer: 59 | Admitting: Family Medicine

## 2022-03-02 ENCOUNTER — Encounter: Payer: Self-pay | Admitting: Family Medicine

## 2022-11-04 ENCOUNTER — Encounter: Payer: Self-pay | Admitting: Hematology and Oncology

## 2022-11-06 ENCOUNTER — Encounter: Payer: Self-pay | Admitting: Hematology and Oncology

## 2022-11-19 ENCOUNTER — Other Ambulatory Visit: Payer: Self-pay

## 2023-01-02 ENCOUNTER — Encounter: Payer: Self-pay | Admitting: Hematology and Oncology

## 2023-01-09 IMAGING — MG MM DIGITAL DIAGNOSTIC UNILAT*R* W/ TOMO W/ CAD
6 of 9 series · 6 of 21 positions shown · non-contrast
Comparison: Recent screening mammography

CLINICAL DATA: The patient was called back for a right breast mass
and right breast calcifications

EXAM:
DIGITAL DIAGNOSTIC UNILATERAL RIGHT MAMMOGRAM WITH TOMOSYNTHESIS AND
CAD; ULTRASOUND RIGHT BREAST LIMITED
TECHNIQUE: Right digital diagnostic mammography and breast tomosynthesis was
performed. The images were evaluated with computer-aided detection.;
Targeted ultrasound examination of the right breast was performed.

[R ML (1 of 2)]
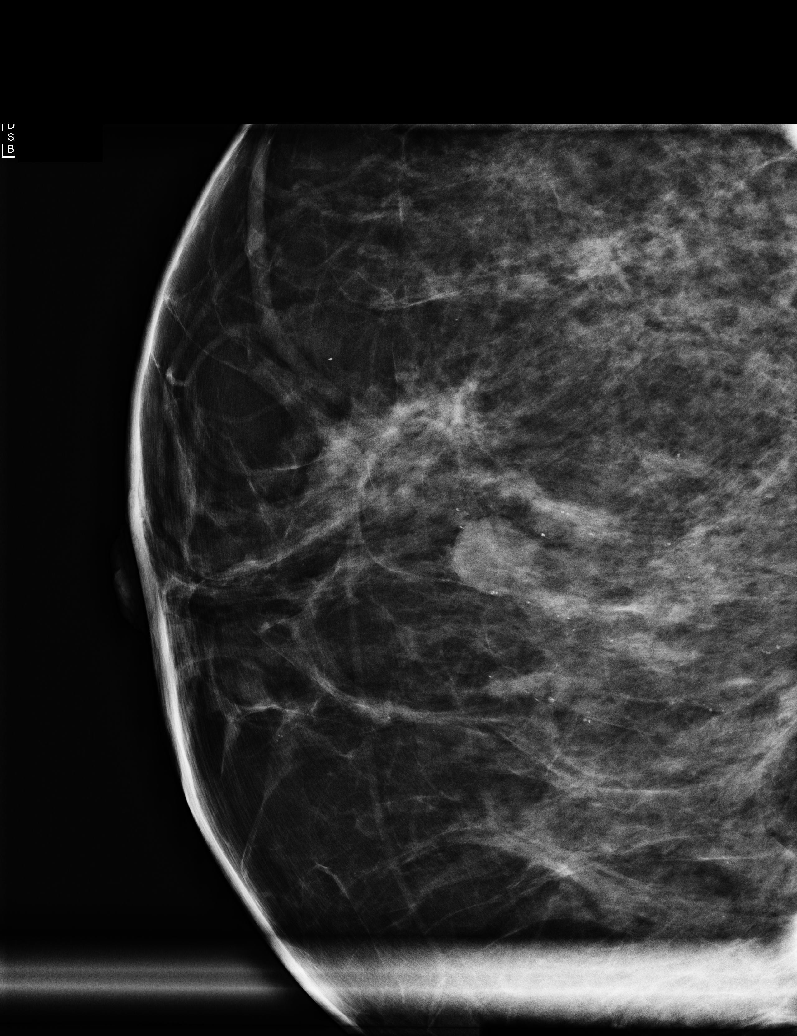

[R CC]
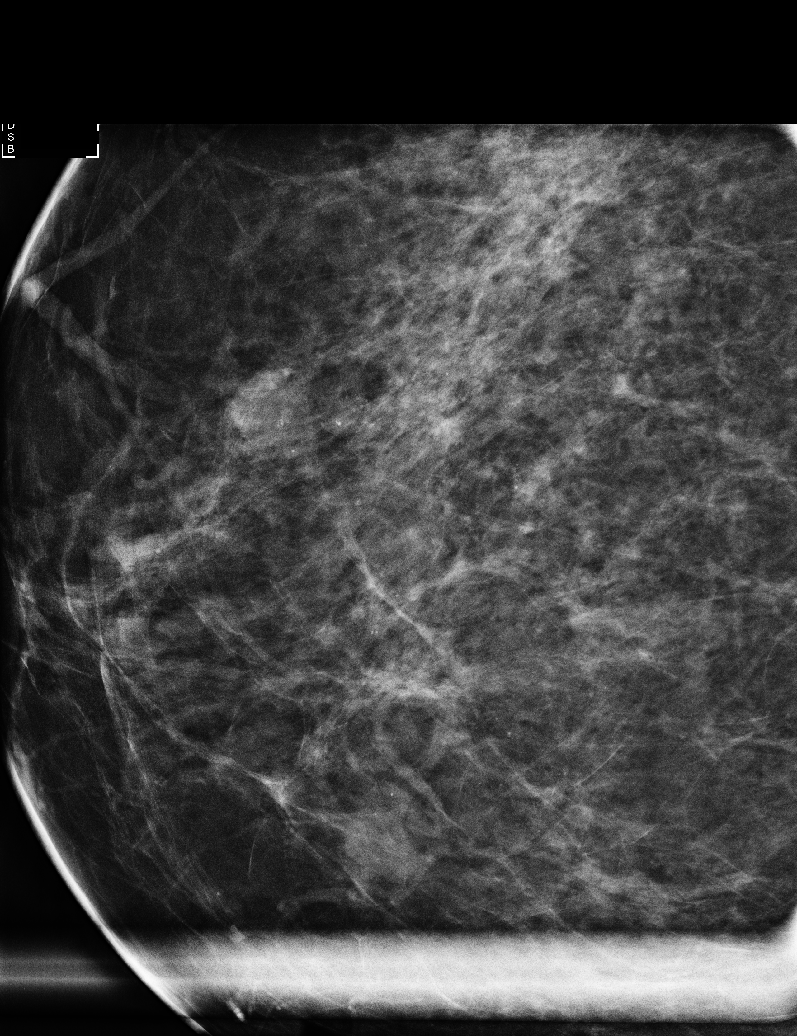

[R ML (2 of 2)]
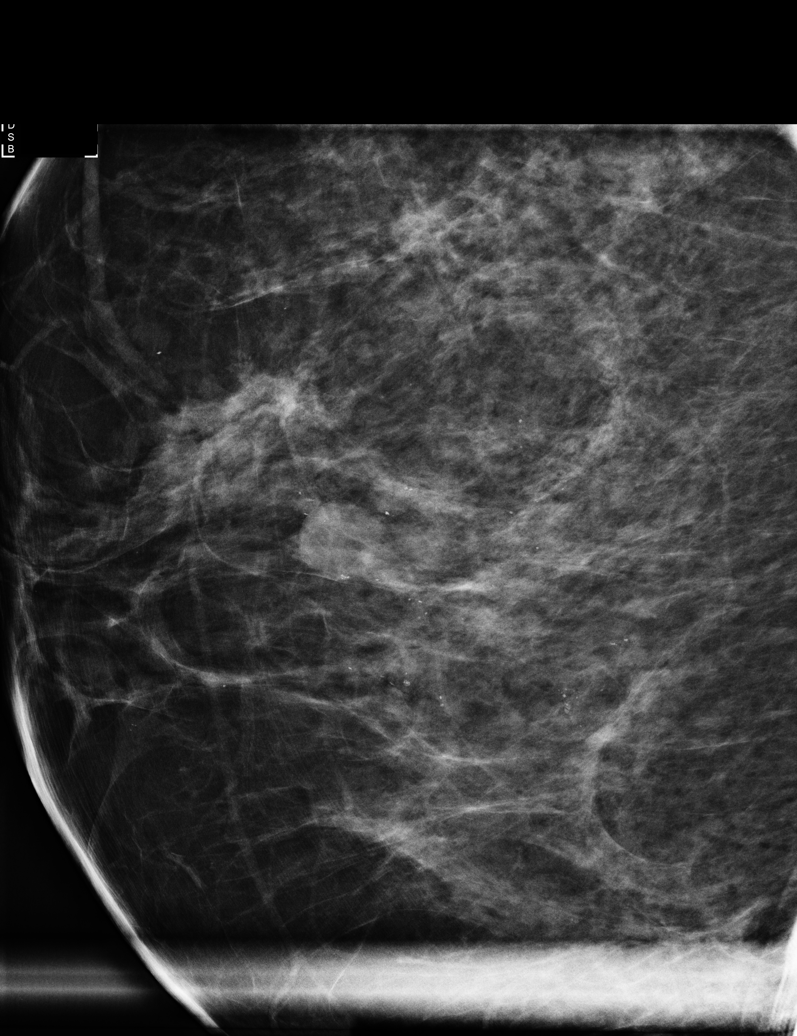

[R CC synth-2D]
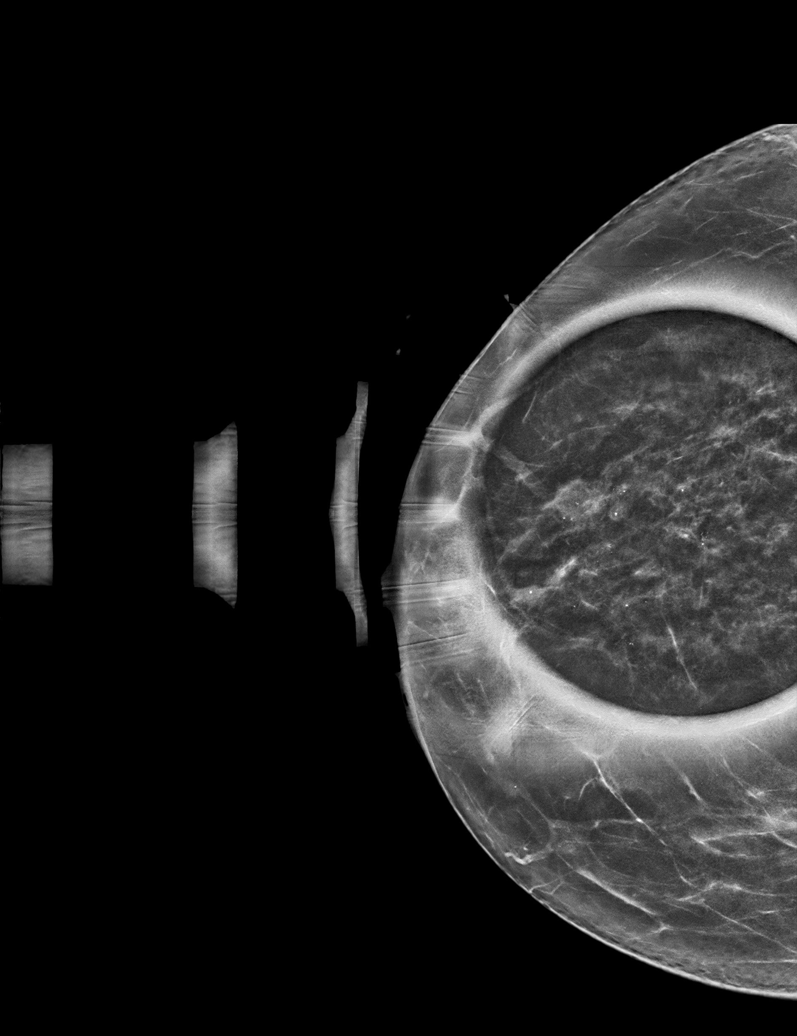

[R ML synth-2D]
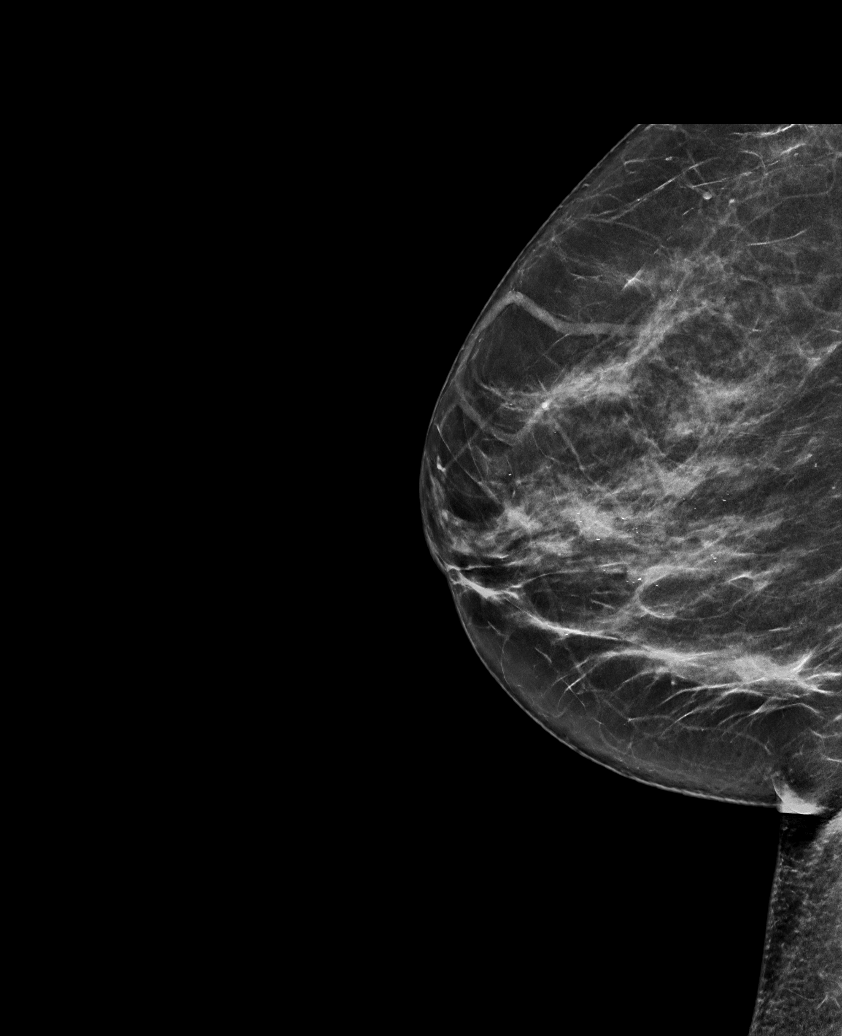

[R MLO synth-2D]
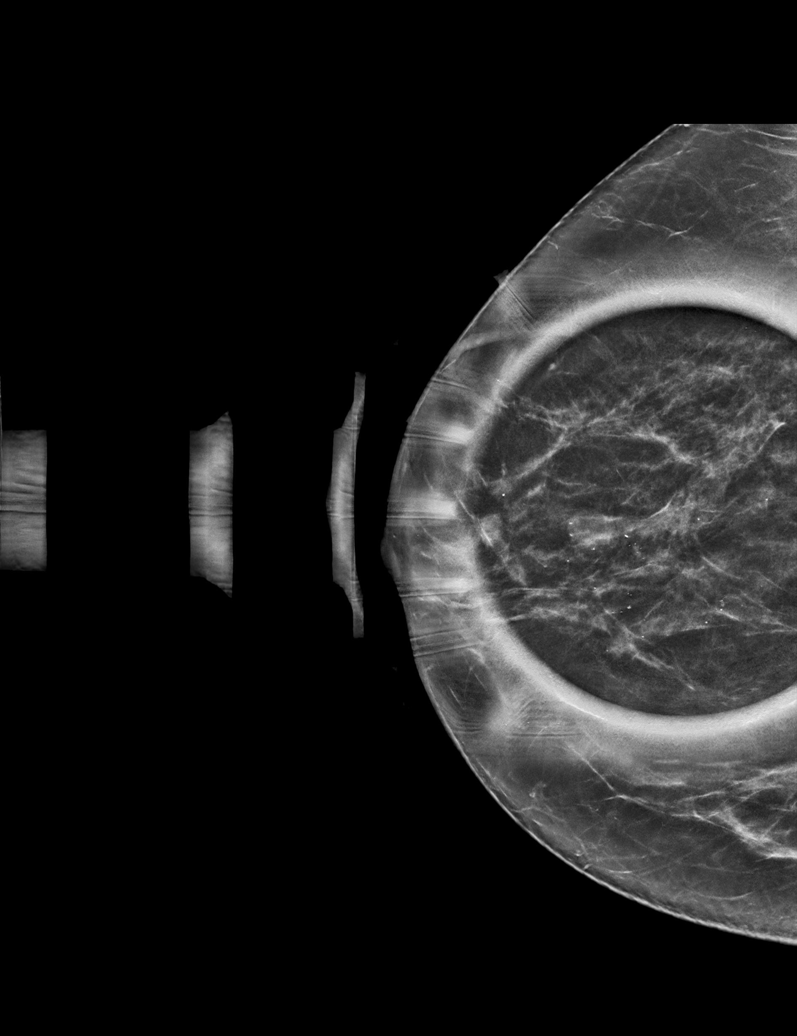

[6 of 21 positions shown; findings below may reference images not displayed]

ACR Breast Density Category b: There are scattered areas of
fibroglandular density.
FINDINGS: The mass in the slightly lateral right breast persists on additional
imaging. The calcifications in the central right breast persists.
Some of these calcifications layer on the 90 degree lateral imaging.

On physical exam, no suspicious lumps are identified.

Targeted ultrasound is performed, showing multiple simple and mildly
complicated cysts. No suspicious findings.
IMPRESSION: The mass seen at screening mammography is a simple cyst. There are
multiple simple and mildly complicated cysts. Several the
calcifications layer consistent with milk of calcium. Other
calcifications do not layer but are probably part of the same
process.

RECOMMENDATION:
Recommend six-month follow-up of the right breast calcifications.

I have discussed the findings and recommendations with the patient.
If applicable, a reminder letter will be sent to the patient
regarding the next appointment.

BI-RADS CATEGORY  3: Probably benign.

## 2023-03-29 ENCOUNTER — Other Ambulatory Visit (HOSPITAL_COMMUNITY): Payer: Self-pay

## 2023-05-02 ENCOUNTER — Encounter: Payer: Self-pay | Admitting: Internal Medicine

## 2023-05-02 ENCOUNTER — Ambulatory Visit: Payer: 59 | Admitting: Internal Medicine

## 2023-05-02 VITALS — BP 120/80 | HR 97 | Temp 98.6°F | Ht 61.0 in | Wt 151.5 lb

## 2023-05-02 DIAGNOSIS — Z1231 Encounter for screening mammogram for malignant neoplasm of breast: Secondary | ICD-10-CM | POA: Diagnosis not present

## 2023-05-02 DIAGNOSIS — D500 Iron deficiency anemia secondary to blood loss (chronic): Secondary | ICD-10-CM | POA: Diagnosis not present

## 2023-05-02 DIAGNOSIS — Z1211 Encounter for screening for malignant neoplasm of colon: Secondary | ICD-10-CM | POA: Diagnosis not present

## 2023-05-02 DIAGNOSIS — Z124 Encounter for screening for malignant neoplasm of cervix: Secondary | ICD-10-CM | POA: Diagnosis not present

## 2023-05-02 DIAGNOSIS — F17200 Nicotine dependence, unspecified, uncomplicated: Secondary | ICD-10-CM | POA: Diagnosis not present

## 2023-05-02 LAB — CBC WITH DIFFERENTIAL/PLATELET
Basophils Absolute: 0.1 10*3/uL (ref 0.0–0.1)
Basophils Relative: 1.5 % (ref 0.0–3.0)
Eosinophils Absolute: 0.4 10*3/uL (ref 0.0–0.7)
Eosinophils Relative: 3.7 % (ref 0.0–5.0)
HCT: 31.6 % — ABNORMAL LOW (ref 36.0–46.0)
Hemoglobin: 9.7 g/dL — ABNORMAL LOW (ref 12.0–15.0)
Lymphocytes Relative: 17.3 % (ref 12.0–46.0)
Lymphs Abs: 1.7 10*3/uL (ref 0.7–4.0)
MCHC: 30.6 g/dL (ref 30.0–36.0)
MCV: 76.2 fl — ABNORMAL LOW (ref 78.0–100.0)
Monocytes Absolute: 0.6 10*3/uL (ref 0.1–1.0)
Monocytes Relative: 6.4 % (ref 3.0–12.0)
Neutro Abs: 6.8 10*3/uL (ref 1.4–7.7)
Neutrophils Relative %: 71.1 % (ref 43.0–77.0)
Platelets: 496 10*3/uL — ABNORMAL HIGH (ref 150.0–400.0)
RBC: 4.14 Mil/uL (ref 3.87–5.11)
RDW: 19.3 % — ABNORMAL HIGH (ref 11.5–15.5)
WBC: 9.5 10*3/uL (ref 4.0–10.5)

## 2023-05-02 LAB — COMPREHENSIVE METABOLIC PANEL
ALT: 24 U/L (ref 0–35)
AST: 28 U/L (ref 0–37)
Albumin: 4.3 g/dL (ref 3.5–5.2)
Alkaline Phosphatase: 82 U/L (ref 39–117)
BUN: 15 mg/dL (ref 6–23)
CO2: 21 mEq/L (ref 19–32)
Calcium: 9.8 mg/dL (ref 8.4–10.5)
Chloride: 105 mEq/L (ref 96–112)
Creatinine, Ser: 0.77 mg/dL (ref 0.40–1.20)
GFR: 90.35 mL/min (ref >60.00)
Glucose, Bld: 86 mg/dL (ref 70–99)
Potassium: 4.1 mEq/L (ref 3.5–5.1)
Sodium: 135 mEq/L (ref 135–145)
Total Bilirubin: 0.2 mg/dL (ref 0.2–1.2)
Total Protein: 8.1 g/dL (ref 6.0–8.3)

## 2023-05-02 LAB — IBC + FERRITIN
Ferritin: 7.3 ng/mL — ABNORMAL LOW (ref 10.0–291.0)
Iron: 28 ug/dL — ABNORMAL LOW (ref 42–145)
Saturation Ratios: 5.4 % — ABNORMAL LOW (ref 20.0–50.0)
TIBC: 520.8 ug/dL — ABNORMAL HIGH (ref 250.0–450.0)
Transferrin: 372 mg/dL — ABNORMAL HIGH (ref 212.0–360.0)

## 2023-05-02 NOTE — Assessment & Plan Note (Signed)
-  I have discussed tobacco cessation with the patient.  I have counseled the patient regarding the negative impacts of continued tobacco use including but not limited to lung cancer, COPD, and cardiovascular disease.  I have discussed alternatives to tobacco and modalities that may help facilitate tobacco cessation including but not limited to biofeedback, hypnosis, and medications.  Total time spent with tobacco counseling was 4 minutes. -She remains in the pre-contemplative stage. -Will continue to discuss at subsequent visit.

## 2023-05-02 NOTE — Assessment & Plan Note (Addendum)
Check CBC and iron studies. Refer to GYN due to menorrhagia and to GI as she is overdue for colonoscopy. She is already on iron supplementation. Have requested information on which one.

## 2023-05-02 NOTE — Progress Notes (Signed)
Established Patient Office Visit     CC/Reason for Visit: Establish care, discuss chronic concerns  HPI: Crystal Meyer is a 52 y.o. female who is coming in today for the above mentioned reasons. Past Medical History is significant for: Iron deficiency anemia thought secondary to menorrhagia from known uterine fibroids.  She works as a Psychologist, sport and exercise at [employer].  She has 2 grown children.  Her daughter is a Engineer, civil (consulting).  She smokes 1/2 pack a day and has done so for about 30 years.  She drinks alcohol occasionally only, no known drug allergies, surgical history only significant for C-section x 2.  She is overdue for breast and colon cancer screening.   Past Medical/Surgical History: Past Medical History:  Diagnosis Date   Abnormal Pap smear    age 56, colpo and cryo   Chlamydia    Gonorrhea    Syphilis     Past Surgical History:  Procedure Laterality Date   CESAREAN SECTION  2002   1997, 2002   INDUCED ABORTION     x2   TUBAL LIGATION  2002    Social History:  reports that she has been smoking cigarettes. She has been smoking an average of .5 packs per day. She has never used smokeless tobacco. She reports current alcohol use. She reports that she does not use drugs.  Allergies: Allergies  Allergen Reactions   Aspirin     Childhood reaction- dehydrated gave too many baby aspirins   Hydrocodone     Family History:  Family History  Problem Relation Age of Onset   Lupus Mother    Sickle cell trait Mother    Diabetes Father    Hypertension Father    Colon cancer Father 41   Healthy Sister    High blood pressure Brother    Stomach cancer Maternal Grandmother    Diabetes Paternal Grandmother    High blood pressure Paternal Grandmother    Blindness Paternal Grandmother        secondary to diabetes   Anesthesia problems Neg Hx      Current Outpatient Medications:    hydrocortisone cream 1 %, Apply 1 application topically 2 (two) times daily as needed  for itching. For eczema, Disp: , Rfl:    ibuprofen (ADVIL) 600 MG tablet, Take 1 tablet (600 mg total) by mouth every 8 (eight) hours as needed., Disp: 90 tablet, Rfl: 1  Review of Systems:  Negative unless indicated in HPI.   Physical Exam: Vitals:   05/02/23 1334  BP: 120/80  Pulse: 97  Temp: 98.6 F (37 C)  TempSrc: Oral  SpO2: 98%  Weight: 151 lb 8 oz (68.7 kg)  Height: 5\' 1"  (1.549 m)    Body mass index is 28.63 kg/m.   Physical Exam Vitals reviewed.  Constitutional:      Appearance: Normal appearance.  HENT:     Head: Normocephalic and atraumatic.  Eyes:     Conjunctiva/sclera: Conjunctivae normal.     Pupils: Pupils are equal, round, and reactive to light.  Cardiovascular:     Rate and Rhythm: Normal rate and regular rhythm.  Pulmonary:     Effort: Pulmonary effort is normal.     Breath sounds: Normal breath sounds.  Skin:    General: Skin is warm and dry.  Neurological:     General: No focal deficit present.     Mental Status: She is alert and oriented to person, place, and time.  Psychiatric:  Mood and Affect: Mood normal.        Behavior: Behavior normal.        Thought Content: Thought content normal.        Judgment: Judgment normal.      Impression and Plan:  Screening for malignant neoplasm of colon -     Ambulatory referral to Gastroenterology  Screening for cervical cancer -     Ambulatory referral to Gynecology  TOBACCO USER Assessment & Plan: -I have discussed tobacco cessation with the patient.  I have counseled the patient regarding the negative impacts of continued tobacco use including but not limited to lung cancer, COPD, and cardiovascular disease.  I have discussed alternatives to tobacco and modalities that may help facilitate tobacco cessation including but not limited to biofeedback, hypnosis, and medications.  Total time spent with tobacco counseling was 4 minutes. -She remains in the pre-contemplative stage. -Will  continue to discuss at subsequent visit.    Iron deficiency anemia due to chronic blood loss Assessment & Plan: Check CBC and iron studies. Refer to GYN due to menorrhagia and to GI as she is overdue for colonoscopy. She is already on iron supplementation. Have requested information on which one.  Orders: -     CBC with Differential/Platelet; Future -     Comprehensive metabolic panel; Future -     IBC + Ferritin; Future  Encounter for screening mammogram for malignant neoplasm of breast -     Digital Screening Mammogram, Left and Right; Future     Time spent:31 minutes reviewing chart, interviewing and examining patient and formulating plan of care. This was independent for time spent for smoking cessation counseling.     Chaya Jan, MD [facility]

## 2023-05-17 NOTE — Telephone Encounter (Signed)
Left message on machine for patient to return our call 

## 2023-05-19 ENCOUNTER — Inpatient Hospital Stay: Payer: 59 | Attending: Adult Health

## 2023-05-19 ENCOUNTER — Other Ambulatory Visit: Payer: Self-pay

## 2023-05-19 ENCOUNTER — Inpatient Hospital Stay (HOSPITAL_BASED_OUTPATIENT_CLINIC_OR_DEPARTMENT_OTHER): Payer: 59 | Admitting: Adult Health

## 2023-05-19 ENCOUNTER — Encounter: Payer: Self-pay | Admitting: Adult Health

## 2023-05-19 ENCOUNTER — Inpatient Hospital Stay (HOSPITAL_COMMUNITY): Admission: RE | Admit: 2023-05-19 | Payer: 59 | Source: Ambulatory Visit

## 2023-05-19 ENCOUNTER — Telehealth: Payer: Self-pay | Admitting: *Deleted

## 2023-05-19 VITALS — BP 141/83 | HR 86 | Temp 97.5°F | Resp 16 | Wt 148.7 lb

## 2023-05-19 DIAGNOSIS — F17210 Nicotine dependence, cigarettes, uncomplicated: Secondary | ICD-10-CM | POA: Insufficient documentation

## 2023-05-19 DIAGNOSIS — D500 Iron deficiency anemia secondary to blood loss (chronic): Secondary | ICD-10-CM

## 2023-05-19 DIAGNOSIS — N920 Excessive and frequent menstruation with regular cycle: Secondary | ICD-10-CM | POA: Insufficient documentation

## 2023-05-19 DIAGNOSIS — R5383 Other fatigue: Secondary | ICD-10-CM | POA: Insufficient documentation

## 2023-05-19 DIAGNOSIS — R0683 Snoring: Secondary | ICD-10-CM | POA: Diagnosis not present

## 2023-05-19 DIAGNOSIS — Z800 Family history of malignant neoplasm of digestive organs: Secondary | ICD-10-CM | POA: Insufficient documentation

## 2023-05-19 DIAGNOSIS — D509 Iron deficiency anemia, unspecified: Secondary | ICD-10-CM | POA: Insufficient documentation

## 2023-05-19 LAB — CBC WITH DIFFERENTIAL (CANCER CENTER ONLY)
Abs Immature Granulocytes: 0.03 10*3/uL (ref 0.00–0.07)
Basophils Absolute: 0.1 10*3/uL (ref 0.0–0.1)
Basophils Relative: 1 %
Eosinophils Absolute: 0.2 10*3/uL (ref 0.0–0.5)
Eosinophils Relative: 3 %
HCT: 29.6 % — ABNORMAL LOW (ref 36.0–46.0)
Hemoglobin: 9 g/dL — ABNORMAL LOW (ref 12.0–15.0)
Immature Granulocytes: 0 %
Lymphocytes Relative: 17 %
Lymphs Abs: 1.5 10*3/uL (ref 0.7–4.0)
MCH: 23.4 pg — ABNORMAL LOW (ref 26.0–34.0)
MCHC: 30.4 g/dL (ref 30.0–36.0)
MCV: 76.9 fL — ABNORMAL LOW (ref 80.0–100.0)
Monocytes Absolute: 0.6 10*3/uL (ref 0.1–1.0)
Monocytes Relative: 7 %
Neutro Abs: 6.3 10*3/uL (ref 1.7–7.7)
Neutrophils Relative %: 72 %
Platelet Count: 530 10*3/uL — ABNORMAL HIGH (ref 150–400)
RBC: 3.85 MIL/uL — ABNORMAL LOW (ref 3.87–5.11)
RDW: 18 % — ABNORMAL HIGH (ref 11.5–15.5)
WBC Count: 8.7 10*3/uL (ref 4.0–10.5)
nRBC: 0.2 % (ref 0.0–0.2)

## 2023-05-19 LAB — CMP (CANCER CENTER ONLY)
ALT: 21 U/L (ref 0–44)
AST: 25 U/L (ref 15–41)
Albumin: 4.2 g/dL (ref 3.5–5.0)
Alkaline Phosphatase: 86 U/L (ref 38–126)
Anion gap: 7 (ref 5–15)
BUN: 12 mg/dL (ref 6–20)
CO2: 26 mmol/L (ref 22–32)
Calcium: 10 mg/dL (ref 8.9–10.3)
Chloride: 105 mmol/L (ref 98–111)
Creatinine: 0.69 mg/dL (ref 0.44–1.00)
GFR, Estimated: >60 mL/min (ref >60)
Glucose, Bld: 83 mg/dL (ref 70–99)
Potassium: 3.8 mmol/L (ref 3.5–5.1)
Sodium: 138 mmol/L (ref 135–145)
Total Bilirubin: 0.2 mg/dL — ABNORMAL LOW (ref 0.3–1.2)
Total Protein: 8.3 g/dL — ABNORMAL HIGH (ref 6.5–8.1)

## 2023-05-19 LAB — IRON AND IRON BINDING CAPACITY (CC-WL,HP ONLY)
Iron: 9 ug/dL — ABNORMAL LOW (ref 28–170)
Saturation Ratios: 2 % — ABNORMAL LOW (ref 10.4–31.8)
TIBC: 543 ug/dL — ABNORMAL HIGH (ref 250–450)
UIBC: 534 ug/dL — ABNORMAL HIGH (ref 148–442)

## 2023-05-19 LAB — FERRITIN: Ferritin: 5 ng/mL — ABNORMAL LOW (ref 11–307)

## 2023-05-19 NOTE — Assessment & Plan Note (Addendum)
Crystal Meyer is a 52 year old woman with iron deficiency anemia here today for follow-up and evaluation.  She continues to be iron deficient and I placed orders for IV Venofer 300 mg weekly x 3.  I also reviewed with her in the referral notes about the GI referral and told her the date they said they called.  She looked in her phone and showed me where she found a missed call from an unknown number.  Gave her the [facility] phone number to call them.  Menorrhagia: I recommended that she get in with GYN.  I sent a message to [name] to evaluate her GYN referral.  She is a current every day smoker.  She is not yet ready to quit.  She is not quite eligible for lung cancer screening.  Her most recent mammogram in 2022 demonstrated calcifications and repeat in 6 months was recommended.  I sent a message to Annia Belt our radiologist to see whether her screening mammogram order should be changed to a diagnostic mammogram considering her previous calcifications.  I also placed a referral to pulmonology for sleep consult at her request due to her snoring.    RTC weekly x 3 for IV iron.  RTC in 12 weeks for labs and f/u.

## 2023-05-19 NOTE — Progress Notes (Signed)
[facility] Cancer Follow up:    Crystal Meyer, Crystal Patricia, MD [address] [place] [address]   DIAGNOSIS: Iron deficiency anemia  SUMMARY OF HEMATOLOGIC HISTORY: Labs on 06/25/20 showed Hg 8.6, HCT 29.6, platelets 325, B-12 492, iron saturation 1%, ferritin 3.  Referral from her PCP to Dr. Pamelia Hoit was made. She was recommended GYN evaluation of menorrhagia and IV iron.   Venofer 300mg  weekly x 3 beginning 07/04/2020 08/13/2020: Hemoglobin 10.7 (improved from 8.6) MCV 83.7, iron saturation 17% Did not f/u with hematology 10/2020-03/2023  CURRENT THERAPY: oral iron "floradix"   INTERVAL HISTORY: Crystal Meyer 52 y.o. female returns for f/u and evaluation of her iron deficiency anemia.   Her LMP began 6/16 and was supposed to stop around 6/20.  She noted pink discharge that was persistent requiring a panty liner/pad for 3-4 days.  In May she had two menstrual cycles for 4-5 days on May 1 and then again on May 19.  Her pad count on her heavy days is about 6-8 per day.    She is supposed to get in with gynecology.  A referral was made and it is closed in the system.  [facility] called her on 05/10/2023.  The patient checked her phone today and noted the call was from an unknown number.  She looked at the voice mail and found the number to call GI back.    She is a current every day smoker.  She started at age 23.  She has a 16 pack year tobacco history.  She is not yet ready to quit.  A mammogram was ordered for [facility] at the breast center.  It has not yet been scheduled.  Her most recent mammogram demonstrated left breast calcifications in 2022.    She also reports snoring and fatigue and is worried that she has sleep apnea.  She is requesting referral to pulmonology for evaluation.    Patient Active Problem List   Diagnosis Date Noted   Iron deficiency anemia due to chronic blood loss 07/02/2020   Menorrhagia 06/25/2020   Hyperglycemia 06/25/2020    TOBACCO USER 07/20/2007    is allergic to aspirin and hydrocodone.  MEDICAL HISTORY: Past Medical History:  Diagnosis Date   Abnormal Pap smear    age 55, colpo and cryo   Chlamydia    Gonorrhea    Syphilis     SURGICAL HISTORY: Past Surgical History:  Procedure Laterality Date   CESAREAN SECTION  2002   1997, 2002   INDUCED ABORTION     x2   TUBAL LIGATION  2002    SOCIAL HISTORY: Social History   Socioeconomic History   Marital status: Single    Spouse name: Not on file   Number of children: 2   Years of education: Not on file   Highest education level: Not on file  Occupational History   Not on file  Tobacco Use   Smoking status: Every Day    Packs/day: .5    Types: Cigarettes   Smokeless tobacco: Never  Vaping Use   Vaping Use: Some days  Substance and Sexual Activity   Alcohol use: Yes    Comment: occasional   Drug use: No   Sexual activity: Yes    Birth control/protection: Surgical  Other Topics Concern   Not on file  Social History Narrative   Not on file   Social Determinants of Health   Financial Resource Strain: Not on file  Food Insecurity: Not on file  Transportation Needs: Not on file  Physical Activity: Not on file  Stress: Not on file  Social Connections: Not on file  Intimate Partner Violence: Not on file    FAMILY HISTORY: Family History  Problem Relation Age of Onset   Lupus Mother    Sickle cell trait Mother    Diabetes Father    Hypertension Father    Colon cancer Father 71   Healthy Sister    High blood pressure Brother    Stomach cancer Maternal Grandmother    Diabetes Paternal Grandmother    High blood pressure Paternal Grandmother    Blindness Paternal Grandmother        secondary to diabetes   Anesthesia problems Neg Hx     Review of Systems  Constitutional:  Positive for fatigue. Negative for appetite change, chills, fever and unexpected weight change.  HENT:   Negative for hearing loss, lump/mass and  trouble swallowing.   Eyes:  Negative for eye problems and icterus.  Respiratory:  Negative for chest tightness, cough and shortness of breath.   Cardiovascular:  Negative for chest pain, leg swelling and palpitations.  Gastrointestinal:  Negative for abdominal distention, abdominal pain, constipation, diarrhea, nausea and vomiting.  Endocrine: Negative for hot flashes.  Genitourinary:  Positive for menstrual problem. Negative for difficulty urinating.   Musculoskeletal:  Negative for arthralgias.  Skin:  Negative for itching and rash.  Neurological:  Negative for dizziness, extremity weakness, headaches and numbness.  Hematological:  Negative for adenopathy. Does not bruise/bleed easily.  Psychiatric/Behavioral:  Positive for sleep disturbance. Negative for depression. The patient is not nervous/anxious.     PHYSICAL EXAMINATION   Onc Performance Status - 05/19/23 1150       ECOG Perf Status   ECOG Perf Status Fully active, able to carry on all pre-disease performance without restriction      KPS SCALE   KPS % SCORE Normal, no compliants, no evidence of disease            Vitals:   05/19/23 1125  BP: (!) 141/83  Pulse: 86  Resp: 16  Temp: (!) 97.5 F (36.4 C)  SpO2: 100%   Physical Exam Constitutional:      General: She is not in acute distress.    Appearance: Normal appearance. She is not toxic-appearing.  HENT:     Head: Normocephalic and atraumatic.     Mouth/Throat:     Mouth: Mucous membranes are moist.     Pharynx: Oropharynx is clear. No oropharyngeal exudate or posterior oropharyngeal erythema.  Eyes:     General: No scleral icterus. Cardiovascular:     Rate and Rhythm: Normal rate and regular rhythm.     Pulses: Normal pulses.     Heart sounds: Normal heart sounds.  Pulmonary:     Effort: Pulmonary effort is normal.     Breath sounds: Normal breath sounds.  Abdominal:     General: Abdomen is flat. Bowel sounds are normal. There is no distension.      Palpations: Abdomen is soft.     Tenderness: There is no abdominal tenderness.  Musculoskeletal:        General: No swelling.     Cervical back: Neck supple.  Lymphadenopathy:     Cervical: No cervical adenopathy.  Skin:    General: Skin is warm and dry.     Findings: No rash.  Neurological:     General: No focal deficit present.  Mental Status: She is alert.  Psychiatric:        Mood and Affect: Mood normal.        Behavior: Behavior normal.     LABORATORY DATA:  CBC    Component Value Date/Time   WBC 8.7 05/19/2023 1108   WBC 9.5 05/02/2023 1401   RBC 3.85 (L) 05/19/2023 1108   HGB 9.0 (L) 05/19/2023 1108   HCT 29.6 (L) 05/19/2023 1108   PLT 530 (H) 05/19/2023 1108   MCV 76.9 (L) 05/19/2023 1108   MCH 23.4 (L) 05/19/2023 1108   MCHC 30.4 05/19/2023 1108   RDW 18.0 (H) 05/19/2023 1108   LYMPHSABS 1.5 05/19/2023 1108   MONOABS 0.6 05/19/2023 1108   EOSABS 0.2 05/19/2023 1108   BASOSABS 0.1 05/19/2023 1108    CMP     Component Value Date/Time   NA 138 05/19/2023 1108   K 3.8 05/19/2023 1108   CL 105 05/19/2023 1108   CO2 26 05/19/2023 1108   GLUCOSE 83 05/19/2023 1108   BUN 12 05/19/2023 1108   CREATININE 0.69 05/19/2023 1108   CREATININE 0.70 06/25/2020 1547   CALCIUM 10.0 05/19/2023 1108   PROT 8.3 (H) 05/19/2023 1108   ALBUMIN 4.2 05/19/2023 1108   AST 25 05/19/2023 1108   ALT 21 05/19/2023 1108   ALKPHOS 86 05/19/2023 1108   BILITOT 0.2 (L) 05/19/2023 1108   GFRNONAA >60 05/19/2023 1108   GFRAA >60 08/13/2020 1135    ASSESSMENT and THERAPY PLAN:   Iron deficiency anemia due to chronic blood loss Aden is a 52 year old woman with iron deficiency anemia here today for follow-up and evaluation.  She continues to be iron deficient and I placed orders for IV Venofer 300 mg weekly x 3.  I also reviewed with her in the referral notes about the GI referral and told her the date they said they called.  She looked in her phone and showed me where she  found a missed call from an unknown number.  Gave her the [facility] phone number to call them.  Menorrhagia: I recommended that she get in with GYN.  I sent a message to [name] to evaluate her GYN referral.  She is a current every day smoker.  She is not yet ready to quit.  She is not quite eligible for lung cancer screening.  I also placed a referral to pulmonology for sleep consult at her request due to her snoring.    RTC weekly x 3 for IV iron.  RTC in 12 weeks for labs and f/u.      All questions were answered. The patient knows to call the clinic with any problems, questions or concerns. We can certainly see the patient much sooner if necessary.  Total encounter time:40 minutes*in face-to-face visit time, chart review, lab review, care coordination, order entry, and documentation of the encounter time.  Lillard Anes, NP 05/19/23 1:44 PM Medical Oncology and Hematology [facility] [address] [place], [place] [address] Tel. [phone]    Fax. [phone]  *Total Encounter Time as defined by the Centers for Medicare and Medicaid Services includes, in addition to the face-to-face time of a patient visit (documented in the note above) non-face-to-face time: obtaining and reviewing outside history, ordering and reviewing medications, tests or procedures, care coordination (communications with other health care professionals or caregivers) and documentation in the medical record.

## 2023-05-19 NOTE — Telephone Encounter (Signed)
Spoke with patient and she stated that she went to the "iron infusion visit, and all referrals have been made through the cancer center".

## 2023-05-30 ENCOUNTER — Inpatient Hospital Stay: Payer: 59 | Attending: Adult Health

## 2023-05-30 VITALS — BP 130/85 | HR 64 | Temp 97.9°F | Resp 18

## 2023-05-30 DIAGNOSIS — N920 Excessive and frequent menstruation with regular cycle: Secondary | ICD-10-CM | POA: Diagnosis not present

## 2023-05-30 DIAGNOSIS — D509 Iron deficiency anemia, unspecified: Secondary | ICD-10-CM | POA: Diagnosis not present

## 2023-05-30 DIAGNOSIS — F17210 Nicotine dependence, cigarettes, uncomplicated: Secondary | ICD-10-CM | POA: Insufficient documentation

## 2023-05-30 DIAGNOSIS — D500 Iron deficiency anemia secondary to blood loss (chronic): Secondary | ICD-10-CM

## 2023-05-30 DIAGNOSIS — Z800 Family history of malignant neoplasm of digestive organs: Secondary | ICD-10-CM | POA: Insufficient documentation

## 2023-05-30 MED ORDER — SODIUM CHLORIDE 0.9 % IV SOLN
300.0000 mg | Freq: Once | INTRAVENOUS | Status: AC
  Administered 2023-05-30: 300 mg via INTRAVENOUS
  Filled 2023-05-30: qty 300

## 2023-05-30 MED ORDER — SODIUM CHLORIDE 0.9 % IV SOLN: Freq: Once | INTRAVENOUS | Status: AC

## 2023-05-30 NOTE — Patient Instructions (Signed)

## 2023-05-30 NOTE — Progress Notes (Signed)
Pt declined to stay for 30 minute observation period post iron infusion. VSS at time of discharge.  

## 2023-06-01 ENCOUNTER — Other Ambulatory Visit: Payer: Self-pay | Admitting: *Deleted

## 2023-06-01 DIAGNOSIS — N920 Excessive and frequent menstruation with regular cycle: Secondary | ICD-10-CM

## 2023-06-01 DIAGNOSIS — D500 Iron deficiency anemia secondary to blood loss (chronic): Secondary | ICD-10-CM

## 2023-06-06 ENCOUNTER — Other Ambulatory Visit: Payer: Self-pay

## 2023-06-06 ENCOUNTER — Inpatient Hospital Stay: Payer: 59

## 2023-06-06 VITALS — BP 137/85 | HR 76 | Temp 98.0°F | Resp 18

## 2023-06-06 DIAGNOSIS — D500 Iron deficiency anemia secondary to blood loss (chronic): Secondary | ICD-10-CM

## 2023-06-06 DIAGNOSIS — N920 Excessive and frequent menstruation with regular cycle: Secondary | ICD-10-CM | POA: Diagnosis not present

## 2023-06-06 DIAGNOSIS — D509 Iron deficiency anemia, unspecified: Secondary | ICD-10-CM | POA: Diagnosis not present

## 2023-06-06 DIAGNOSIS — F17210 Nicotine dependence, cigarettes, uncomplicated: Secondary | ICD-10-CM | POA: Diagnosis not present

## 2023-06-06 DIAGNOSIS — Z800 Family history of malignant neoplasm of digestive organs: Secondary | ICD-10-CM | POA: Diagnosis not present

## 2023-06-06 MED ORDER — SODIUM CHLORIDE 0.9 % IV SOLN
300.0000 mg | Freq: Once | INTRAVENOUS | Status: AC
  Administered 2023-06-06: 300 mg via INTRAVENOUS
  Filled 2023-06-06: qty 300

## 2023-06-06 MED ORDER — SODIUM CHLORIDE 0.9 % IV SOLN: Freq: Once | INTRAVENOUS | Status: AC

## 2023-06-06 NOTE — Patient Instructions (Signed)
Iron Sucrose Injection What is this medication? IRON SUCROSE (EYE ern SOO krose) treats low levels of iron (iron deficiency anemia) in people with kidney disease. Iron is a mineral that plays an important role in making red blood cells, which carry oxygen from your lungs to the rest of your body. This medicine may be used for other purposes; ask your health care provider or pharmacist if you have questions. COMMON BRAND NAME(S): Venofer What should I tell my care team before I take this medication? They need to know if you have any of these conditions: Anemia not caused by low iron levels Heart disease High levels of iron in the blood Kidney disease Liver disease An unusual or allergic reaction to iron, other medications, foods, dyes, or preservatives Pregnant or trying to get pregnant Breastfeeding How should I use this medication? This medication is for infusion into a vein. It is given in a hospital or clinic setting. Talk to your care team about the use of this medication in children. While this medication may be prescribed for children as young as 2 years for selected conditions, precautions do apply. Overdosage: If you think you have taken too much of this medicine contact a poison control center or emergency room at once. NOTE: This medicine is only for you. Do not share this medicine with others. What if I miss a dose? Keep appointments for follow-up doses. It is important not to miss your dose. Call your care team if you are unable to keep an appointment. What may interact with this medication? Do not take this medication with any of the following: Deferoxamine Dimercaprol Other iron products This medication may also interact with the following: Chloramphenicol Deferasirox This list may not describe all possible interactions. Give your health care provider a list of all the medicines, herbs, non-prescription drugs, or dietary supplements you use. Also tell them if you smoke,  drink alcohol, or use illegal drugs. Some items may interact with your medicine. What should I watch for while using this medication? Visit your care team regularly. Tell your care team if your symptoms do not start to get better or if they get worse. You may need blood work done while you are taking this medication. You may need to follow a special diet. Talk to your care team. Foods that contain iron include: whole grains/cereals, dried fruits, beans, or peas, leafy green vegetables, and organ meats (liver, kidney). What side effects may I notice from receiving this medication? Side effects that you should report to your care team as soon as possible: Allergic reactions--skin rash, itching, hives, swelling of the face, lips, tongue, or throat Low blood pressure--dizziness, feeling faint or lightheaded, blurry vision Shortness of breath Side effects that usually do not require medical attention (report to your care team if they continue or are bothersome): Flushing Headache Joint pain Muscle pain Nausea Pain, redness, or irritation at injection site This list may not describe all possible side effects. Call your doctor for medical advice about side effects. You may report side effects to FDA at 1-800-FDA-1088. Where should I keep my medication? This medication is given in a hospital or clinic and will not be stored at home. NOTE: This sheet is a summary. It may not cover all possible information. If you have questions about this medicine, talk to your doctor, pharmacist, or health care provider.  2024 Elsevier/Gold Standard (2022-05-19 00:00:00)  

## 2023-06-06 NOTE — Progress Notes (Signed)
Patient declined to stay for post iron 30 minute observation.

## 2023-06-13 ENCOUNTER — Other Ambulatory Visit: Payer: Self-pay

## 2023-06-13 ENCOUNTER — Inpatient Hospital Stay: Payer: 59

## 2023-06-13 VITALS — BP 138/94 | HR 62 | Temp 98.4°F | Resp 16

## 2023-06-13 DIAGNOSIS — D500 Iron deficiency anemia secondary to blood loss (chronic): Secondary | ICD-10-CM

## 2023-06-13 DIAGNOSIS — Z800 Family history of malignant neoplasm of digestive organs: Secondary | ICD-10-CM | POA: Diagnosis not present

## 2023-06-13 DIAGNOSIS — D509 Iron deficiency anemia, unspecified: Secondary | ICD-10-CM | POA: Diagnosis not present

## 2023-06-13 DIAGNOSIS — N920 Excessive and frequent menstruation with regular cycle: Secondary | ICD-10-CM | POA: Diagnosis not present

## 2023-06-13 DIAGNOSIS — F17210 Nicotine dependence, cigarettes, uncomplicated: Secondary | ICD-10-CM | POA: Diagnosis not present

## 2023-06-13 MED ORDER — SODIUM CHLORIDE 0.9 % IV SOLN: Freq: Once | INTRAVENOUS | Status: AC

## 2023-06-13 MED ORDER — SODIUM CHLORIDE 0.9 % IV SOLN
300.0000 mg | Freq: Once | INTRAVENOUS | Status: AC
  Administered 2023-06-13: 300 mg via INTRAVENOUS
  Filled 2023-06-13: qty 300

## 2023-06-13 NOTE — Patient Instructions (Signed)
Iron Sucrose Injection What is this medication? IRON SUCROSE (EYE ern SOO krose) treats low levels of iron (iron deficiency anemia) in people with kidney disease. Iron is a mineral that plays an important role in making red blood cells, which carry oxygen from your lungs to the rest of your body. This medicine may be used for other purposes; ask your health care provider or pharmacist if you have questions. COMMON BRAND NAME(S): Venofer What should I tell my care team before I take this medication? They need to know if you have any of these conditions: Anemia not caused by low iron levels Heart disease High levels of iron in the blood Kidney disease Liver disease An unusual or allergic reaction to iron, other medications, foods, dyes, or preservatives Pregnant or trying to get pregnant Breastfeeding How should I use this medication? This medication is for infusion into a vein. It is given in a hospital or clinic setting. Talk to your care team about the use of this medication in children. While this medication may be prescribed for children as young as 2 years for selected conditions, precautions do apply. Overdosage: If you think you have taken too much of this medicine contact a poison control center or emergency room at once. NOTE: This medicine is only for you. Do not share this medicine with others. What if I miss a dose? Keep appointments for follow-up doses. It is important not to miss your dose. Call your care team if you are unable to keep an appointment. What may interact with this medication? Do not take this medication with any of the following: Deferoxamine Dimercaprol Other iron products This medication may also interact with the following: Chloramphenicol Deferasirox This list may not describe all possible interactions. Give your health care provider a list of all the medicines, herbs, non-prescription drugs, or dietary supplements you use. Also tell them if you smoke,  drink alcohol, or use illegal drugs. Some items may interact with your medicine. What should I watch for while using this medication? Visit your care team regularly. Tell your care team if your symptoms do not start to get better or if they get worse. You may need blood work done while you are taking this medication. You may need to follow a special diet. Talk to your care team. Foods that contain iron include: whole grains/cereals, dried fruits, beans, or peas, leafy green vegetables, and organ meats (liver, kidney). What side effects may I notice from receiving this medication? Side effects that you should report to your care team as soon as possible: Allergic reactions--skin rash, itching, hives, swelling of the face, lips, tongue, or throat Low blood pressure--dizziness, feeling faint or lightheaded, blurry vision Shortness of breath Side effects that usually do not require medical attention (report to your care team if they continue or are bothersome): Flushing Headache Joint pain Muscle pain Nausea Pain, redness, or irritation at injection site This list may not describe all possible side effects. Call your doctor for medical advice about side effects. You may report side effects to FDA at 1-800-FDA-1088. Where should I keep my medication? This medication is given in a hospital or clinic. It will not be stored at home. NOTE: This sheet is a summary. It may not cover all possible information. If you have questions about this medicine, talk to your doctor, pharmacist, or health care provider.  2024 Elsevier/Gold Standard (2023-04-15 00:00:00)

## 2023-06-23 ENCOUNTER — Other Ambulatory Visit: Payer: Self-pay | Admitting: Adult Health

## 2023-06-23 DIAGNOSIS — R921 Mammographic calcification found on diagnostic imaging of breast: Secondary | ICD-10-CM

## 2023-06-23 DIAGNOSIS — D500 Iron deficiency anemia secondary to blood loss (chronic): Secondary | ICD-10-CM

## 2023-06-23 DIAGNOSIS — N920 Excessive and frequent menstruation with regular cycle: Secondary | ICD-10-CM

## 2023-06-29 ENCOUNTER — Encounter: Payer: 59 | Admitting: Obstetrics and Gynecology

## 2023-08-10 ENCOUNTER — Other Ambulatory Visit: Payer: Self-pay

## 2023-08-10 DIAGNOSIS — D500 Iron deficiency anemia secondary to blood loss (chronic): Secondary | ICD-10-CM

## 2023-08-10 NOTE — Assessment & Plan Note (Deleted)
Secondary to heavy menstrual losses 06/25/2020: Iron saturation 1%, ferritin 3, hemoglobin 8.6, MCV 72.7, RDW 17.1 08/13/2020: Hemoglobin 10.7 (improved from 8.6) MCV 83.7, iron saturation 17%, TIBC 311 08/11/2023:   IV iron: August 2021 Patient has had significant improvement in her hemoglobin and iron studies.

## 2023-08-11 ENCOUNTER — Inpatient Hospital Stay: Payer: 59 | Attending: Adult Health

## 2023-08-11 ENCOUNTER — Inpatient Hospital Stay: Payer: 59 | Admitting: Hematology and Oncology

## 2023-08-11 DIAGNOSIS — D500 Iron deficiency anemia secondary to blood loss (chronic): Secondary | ICD-10-CM

## 2024-07-11 ENCOUNTER — Other Ambulatory Visit (HOSPITAL_COMMUNITY)
Admission: RE | Admit: 2024-07-11 | Discharge: 2024-07-11 | Disposition: A | Discharge status: Home / Self Care | Source: Ambulatory Visit | Attending: Internal Medicine | Admitting: Internal Medicine

## 2024-07-11 ENCOUNTER — Encounter: Payer: Self-pay | Admitting: Internal Medicine

## 2024-07-11 ENCOUNTER — Ambulatory Visit (INDEPENDENT_AMBULATORY_CARE_PROVIDER_SITE_OTHER): Admitting: Internal Medicine

## 2024-07-11 VITALS — HR 73 | Temp 97.8°F | Ht 62.25 in | Wt 156.1 lb

## 2024-07-11 DIAGNOSIS — Z113 Encounter for screening for infections with a predominantly sexual mode of transmission: Secondary | ICD-10-CM

## 2024-07-11 DIAGNOSIS — D500 Iron deficiency anemia secondary to blood loss (chronic): Secondary | ICD-10-CM

## 2024-07-11 DIAGNOSIS — Z1231 Encounter for screening mammogram for malignant neoplasm of breast: Secondary | ICD-10-CM | POA: Diagnosis not present

## 2024-07-11 DIAGNOSIS — Z23 Encounter for immunization: Secondary | ICD-10-CM

## 2024-07-11 DIAGNOSIS — Z1211 Encounter for screening for malignant neoplasm of colon: Secondary | ICD-10-CM | POA: Diagnosis not present

## 2024-07-11 DIAGNOSIS — Z124 Encounter for screening for malignant neoplasm of cervix: Secondary | ICD-10-CM | POA: Insufficient documentation

## 2024-07-11 DIAGNOSIS — Z0000 Encounter for general adult medical examination without abnormal findings: Secondary | ICD-10-CM | POA: Diagnosis not present

## 2024-07-11 DIAGNOSIS — Z122 Encounter for screening for malignant neoplasm of respiratory organs: Secondary | ICD-10-CM

## 2024-07-11 DIAGNOSIS — R739 Hyperglycemia, unspecified: Secondary | ICD-10-CM | POA: Diagnosis not present

## 2024-07-11 NOTE — Progress Notes (Signed)
 Established Patient Office Visit     CC/Reason for Visit: Annual preventive exam, STD screening  HPI: Crystal Meyer is a 53 y.o. female who is coming in today for the above mentioned reasons. Past Medical History is significant for: Ongoing nicotine dependence, iron  deficiency anemia.  Feels well without major concerns or complaints.  Is requesting STD screening.  Is overdue for eye and dental care and will schedule.  She is due for shingles and pneumonia vaccines.  She is due for breast, colon, cervical and lung cancer screening.   Past Medical/Surgical History: Past Medical History:  Diagnosis Date   Abnormal Pap smear    age 42, colpo and cryo   Chlamydia    Gonorrhea    Syphilis     Past Surgical History:  Procedure Laterality Date   CESAREAN SECTION  2002   1997, 2002   INDUCED ABORTION     x2   TUBAL LIGATION  2002    Social History:  reports that she has been smoking cigarettes. She has never used smokeless tobacco. She reports current alcohol use. She reports that she does not use drugs.  Allergies: Allergies  Allergen Reactions   Aspirin     Childhood reaction- dehydrated gave too many baby aspirins   Hydrocodone     Family History:  Family History  Problem Relation Age of Onset   Lupus Mother    Sickle cell trait Mother    Diabetes Father    Hypertension Father    Colon cancer Father 27   Healthy Sister    High blood pressure Brother    Stomach cancer Maternal Grandmother    Diabetes Paternal Grandmother    High blood pressure Paternal Grandmother    Blindness Paternal Grandmother        secondary to diabetes   Anesthesia problems Neg Hx      Current Outpatient Medications:    ibuprofen  (ADVIL ) 600 MG tablet, Take 1 tablet (600 mg total) by mouth every 8 (eight) hours as needed., Disp: 90 tablet, Rfl: 1   Multiple Vitamins-Minerals (WOMENS MULTIVITAMIN PO), Take by mouth., Disp: , Rfl:    hydrocortisone cream 1 %, Apply 1  application topically 2 (two) times daily as needed for itching. For eczema, Disp: , Rfl:    Safety Seal Miscellaneous MISC, 10 mLs by Does not apply route daily. Floradix iron  & herbal supplement, Disp: , Rfl:   Review of Systems:  Negative unless indicated in HPI.   Physical Exam: Vitals:   07/11/24 1434  Pulse: 73  Temp: 97.8 F (36.6 C)  TempSrc: Oral  SpO2: 98%  Weight: 156 lb 1.6 oz (70.8 kg)  Height: 5' 2.25 (1.581 m)    Body mass index is 28.32 kg/m.   Physical Exam Vitals reviewed. Exam conducted with a chaperone present.  Constitutional:      General: She is not in acute distress.    Appearance: Normal appearance. She is not ill-appearing, toxic-appearing or diaphoretic.  HENT:     Head: Normocephalic.     Right Ear: Tympanic membrane, ear canal and external ear normal. There is no impacted cerumen.     Left Ear: Tympanic membrane, ear canal and external ear normal. There is no impacted cerumen.     Nose: Nose normal.     Mouth/Throat:     Mouth: Mucous membranes are moist.     Pharynx: Oropharynx is clear. No oropharyngeal exudate or posterior oropharyngeal erythema.  Eyes:  General: No scleral icterus.       Right eye: No discharge.        Left eye: No discharge.     Conjunctiva/sclera: Conjunctivae normal.     Pupils: Pupils are equal, round, and reactive to light.  Neck:     Vascular: No carotid bruit.  Cardiovascular:     Rate and Rhythm: Normal rate and regular rhythm.     Pulses: Normal pulses.     Heart sounds: Normal heart sounds.  Pulmonary:     Effort: Pulmonary effort is normal. No respiratory distress.     Breath sounds: Normal breath sounds.  Abdominal:     General: Abdomen is flat. Bowel sounds are normal.     Palpations: Abdomen is soft.  Genitourinary:    General: Normal vulva.     Vagina: Normal.     Cervix: Normal.  Musculoskeletal:        General: Normal range of motion.     Cervical back: Normal range of motion.  Skin:     General: Skin is warm and dry.  Neurological:     General: No focal deficit present.     Mental Status: She is alert and oriented to person, place, and time. Mental status is at baseline.  Psychiatric:        Mood and Affect: Mood normal.        Behavior: Behavior normal.        Thought Content: Thought content normal.        Judgment: Judgment normal.     Flowsheet Row Office Visit from 07/11/2024 in [facility] [place]  PHQ-9 Total Score 0     Impression and Plan:  Encounter for preventive health examination -     Hemoglobin A1c; Future -     Comprehensive metabolic panel with GFR; Future -     Lipid panel; Future -     TSH; Future -     Vitamin B12; Future -     VITAMIN D  25 Hydroxy (Vit-D Deficiency, Fractures); Future  Iron  deficiency anemia due to chronic blood loss -     CBC with Differential/Platelet; Future  Hyperglycemia  Immunization due  Screen for STD (sexually transmitted disease) -     Cervicovaginal ancillary only -     Hepatitis panel, acute; Future -     RPR; Future -     HIV Antibody (routine testing w rflx); Future  Screening mammogram for breast cancer -     3D Screening Mammogram, Left and Right; Future  Screening for colon cancer -     Ambulatory referral to Gastroenterology  Screening for lung cancer -     Ambulatory Referral for Lung Cancer Scre   -Recommend routine eye and dental care. -Healthy lifestyle discussed in detail. -Labs to be updated today. -Prostate cancer screening: Not applicable Health Maintenance  Topic Date Due   Pneumococcal Vaccine for age over 89 (1 of 2 - PCV) Never done   Hepatitis B Vaccine (1 of 3 - 19+ 3-dose series) Never done   Colon Cancer Screening  Never done   Mammogram  07/20/2023   Zoster (Shingles) Vaccine (1 of 2) Never done   COVID-19 Vaccine (3 - 2024-25 season) 07/24/2023   Flu Shot  06/22/2024   Pap with HPV screening  10/31/2025   DTaP/Tdap/Td vaccine (2 - Td or  Tdap) 11/22/2025   Hepatitis C Screening  Completed   HIV Screening  Completed   HPV Vaccine  Aged Out   Meningitis B Vaccine  Aged Out     -First shingles vaccine in office today. - STD screening performed.  We had discussed safe sexual practices.    Tully Theophilus Andrews, MD [facility]

## 2024-07-11 NOTE — Addendum Note (Signed)
 Addended by: KATHRYNE MILLMAN B on: 07/11/2024 03:55 PM   Modules accepted: Orders

## 2024-07-11 NOTE — Addendum Note (Signed)
 Addended by: KATHRYNE VERNELL NOVAK on: 07/11/2024 03:36 PM   Modules accepted: Orders

## 2024-07-12 LAB — VITAMIN B12: Vitamin B-12: 522 pg/mL (ref 211–911)

## 2024-07-12 LAB — CBC WITH DIFFERENTIAL/PLATELET
Basophils Absolute: 0.1 K/uL (ref 0.0–0.1)
Basophils Relative: 0.9 % (ref 0.0–3.0)
Eosinophils Absolute: 0.3 K/uL (ref 0.0–0.7)
Eosinophils Relative: 4.1 % (ref 0.0–5.0)
HCT: 39.8 % (ref 36.0–46.0)
Hemoglobin: 12.7 g/dL (ref 12.0–15.0)
Lymphocytes Relative: 24.7 % (ref 12.0–46.0)
Lymphs Abs: 1.8 K/uL (ref 0.7–4.0)
MCHC: 31.8 g/dL (ref 30.0–36.0)
MCV: 83.8 fl (ref 78.0–100.0)
Monocytes Absolute: 0.4 K/uL (ref 0.1–1.0)
Monocytes Relative: 5.8 % (ref 3.0–12.0)
Neutro Abs: 4.8 K/uL (ref 1.4–7.7)
Neutrophils Relative %: 64.5 % (ref 43.0–77.0)
Platelets: 361 K/uL (ref 150.0–400.0)
RBC: 4.76 Mil/uL (ref 3.87–5.11)
RDW: 18.6 % — ABNORMAL HIGH (ref 11.5–15.5)
WBC: 7.5 K/uL (ref 4.0–10.5)

## 2024-07-12 LAB — VITAMIN D 25 HYDROXY (VIT D DEFICIENCY, FRACTURES): VITD: 27.45 ng/mL — ABNORMAL LOW (ref 30.00–100.00)

## 2024-07-12 LAB — LIPID PANEL
Cholesterol: 235 mg/dL — ABNORMAL HIGH (ref 0–200)
HDL: 55.1 mg/dL (ref >39.00)
LDL Cholesterol: 152 mg/dL — ABNORMAL HIGH (ref 0–99)
NonHDL: 179.62
Total CHOL/HDL Ratio: 4
Triglycerides: 136 mg/dL (ref 0.0–149.0)
VLDL: 27.2 mg/dL (ref 0.0–40.0)

## 2024-07-12 LAB — COMPREHENSIVE METABOLIC PANEL WITH GFR
ALT: 17 U/L (ref 0–35)
AST: 23 U/L (ref 0–37)
Albumin: 4.4 g/dL (ref 3.5–5.2)
Alkaline Phosphatase: 86 U/L (ref 39–117)
BUN: 7 mg/dL (ref 6–23)
CO2: 23 meq/L (ref 19–32)
Calcium: 9.3 mg/dL (ref 8.4–10.5)
Chloride: 102 meq/L (ref 96–112)
Creatinine, Ser: 0.71 mg/dL (ref 0.40–1.20)
GFR: 98.75 mL/min (ref >60.00)
Glucose, Bld: 78 mg/dL (ref 70–99)
Potassium: 3.9 meq/L (ref 3.5–5.1)
Sodium: 136 meq/L (ref 135–145)
Total Bilirubin: 0.3 mg/dL (ref 0.2–1.2)
Total Protein: 8 g/dL (ref 6.0–8.3)

## 2024-07-12 LAB — TSH: TSH: 1.99 u[IU]/mL (ref 0.35–5.50)

## 2024-07-12 LAB — HEMOGLOBIN A1C: Hgb A1c MFr Bld: 6.5 % (ref 4.6–6.5)

## 2024-07-13 LAB — HIV ANTIBODY (ROUTINE TESTING W REFLEX): HIV 1&2 Ab, 4th Generation: NONREACTIVE

## 2024-07-13 LAB — HEPATITIS PANEL, ACUTE
Hep A IgM: NONREACTIVE
Hep B C IgM: NONREACTIVE
Hepatitis B Surface Ag: NONREACTIVE
Hepatitis C Ab: NONREACTIVE

## 2024-07-13 LAB — T PALLIDUM AB: T Pallidum Abs: POSITIVE — AB

## 2024-07-13 LAB — CERVICOVAGINAL ANCILLARY ONLY
Bacterial Vaginitis (gardnerella): POSITIVE — AB
Candida Glabrata: NEGATIVE
Candida Vaginitis: NEGATIVE
Chlamydia: NEGATIVE
Comment: NEGATIVE
Comment: NEGATIVE
Comment: NEGATIVE
Comment: NEGATIVE
Comment: NEGATIVE
Comment: NORMAL
Neisseria Gonorrhea: NEGATIVE
Trichomonas: NEGATIVE

## 2024-07-13 LAB — RPR: RPR Ser Ql: REACTIVE — AB

## 2024-07-13 LAB — RPR TITER: RPR Titer: 1:2 {titer} — ABNORMAL HIGH

## 2024-07-16 ENCOUNTER — Encounter: Payer: Self-pay | Admitting: Internal Medicine

## 2024-07-17 ENCOUNTER — Telehealth: Payer: Self-pay | Admitting: *Deleted

## 2024-07-17 ENCOUNTER — Ambulatory Visit: Payer: Self-pay | Admitting: Internal Medicine

## 2024-07-17 DIAGNOSIS — E559 Vitamin D deficiency, unspecified: Secondary | ICD-10-CM

## 2024-07-17 DIAGNOSIS — B9689 Other specified bacterial agents as the cause of diseases classified elsewhere: Secondary | ICD-10-CM

## 2024-07-17 DIAGNOSIS — A539 Syphilis, unspecified: Secondary | ICD-10-CM

## 2024-07-17 DIAGNOSIS — E1169 Type 2 diabetes mellitus with other specified complication: Secondary | ICD-10-CM

## 2024-07-17 LAB — CYTOLOGY - PAP
Adequacy: ABSENT
Comment: NEGATIVE
Diagnosis: NEGATIVE
Diagnosis: REACTIVE
High risk HPV: NEGATIVE

## 2024-07-17 MED ORDER — VITAMIN D (ERGOCALCIFEROL) 1.25 MG (50000 UNIT) PO CAPS
50000.0000 [IU] | ORAL_CAPSULE | ORAL | 0 refills | Status: AC
  Filled 2024-08-28 – 2024-10-15 (×3): qty 4, 28d supply, fill #0

## 2024-07-17 MED ORDER — ROSUVASTATIN CALCIUM 5 MG PO TABS
5.0000 mg | ORAL_TABLET | Freq: Every day | ORAL | 1 refills | Status: AC
  Filled 2024-08-28: qty 90, 90d supply, fill #0
  Filled 2024-11-25: qty 60, 60d supply, fill #1

## 2024-07-17 MED ORDER — METRONIDAZOLE 500 MG PO TABS: 2000.0000 mg | ORAL_TABLET | Freq: Once | ORAL | 0 refills | Status: AC

## 2024-07-17 NOTE — Telephone Encounter (Signed)
Spoke with patient. See lab result note. 

## 2024-07-17 NOTE — Telephone Encounter (Signed)
 Copied from CRM #[id number]. Topic: General - Other >> Jul 17, 2024 10:19 AM Robinson H wrote: Reason for CRM: Patient returning call to [facility], please reach back out to patient. Patient is in the medical assistant class at [employer] and won't be free until 12pm, okay to send message in MyChart or leave [employer] [phone]

## 2024-07-18 NOTE — Telephone Encounter (Signed)
 Results faxed and confirmed to [facility].

## 2024-08-01 ENCOUNTER — Encounter: Payer: Self-pay | Admitting: Internal Medicine

## 2024-08-02 MED ORDER — FLUCONAZOLE 150 MG PO TABS
150.0000 mg | ORAL_TABLET | Freq: Once | ORAL | 0 refills | Status: AC
Start: 2024-08-02 — End: 2024-08-02

## 2024-08-14 ENCOUNTER — Ambulatory Visit: Payer: Self-pay

## 2024-08-14 NOTE — Telephone Encounter (Signed)
 Noted

## 2024-08-14 NOTE — Telephone Encounter (Addendum)
 FYI Only or Action Required?: FYI only for provider.  Patient was last seen in primary care on 07/11/2024 by Crystal Meyer, Tully GRADE, MD.  Called Nurse Triage reporting Hernia.  Symptoms began several days ago.  Interventions attempted: Nothing.  Symptoms are: unchanged.  Triage Disposition: See Physician Within 24 Hours  Patient/caregiver understands and will follow disposition?: Yes      Copied from CRM #[id number]. Topic: Clinical - Red Word Triage >> Aug 14, 2024  2:37 PM [place] A wrote: [facility] that prompted transfer to Nurse Triage: Patient has hard area possible Hernia in navel area. Sensitive to touch patient noticed about three days ago. Reason for Disposition  Can't reduce the hernia  (Note: NO pain, tenderness of hernia, or vomiting)  Answer Assessment - Initial Assessment Questions 1. ONSET:  When did this first appear?     3 days ago 2. APPEARANCE: What does it look like?     Feel like a little know 3. SIZE: How big is it? (e.g., inches, cm; or compare to coins, fruit)     Size of quarter 4. LOCATION: Where exactly is the hernia located?     [place] 5. PATTERN: Does the swelling come and go, or has it been constant since it started?     constant 6. PAIN: Is there any pain? If Yes, ask: How bad is it?  (Scale 0-10; or none, mild, moderate, severe)     Occasional tender to touch 7. DIAGNOSIS: Have you been seen by a doctor (or NP/PA) for this? Did the doctor diagnose you as having a hernia?     N/a 8. OTHER SYMPTOMS: Do you have any other symptoms? (e.g., fever, abdomen pain, vomiting)     denies 9. PREGNANCY: Is there any chance you are pregnant? When was your last menstrual period?     N/a  Protocols used: Hernia-A-AH

## 2024-08-15 ENCOUNTER — Ambulatory Visit: Admitting: Internal Medicine

## 2024-08-15 ENCOUNTER — Encounter: Payer: Self-pay | Admitting: Internal Medicine

## 2024-08-15 VITALS — BP 120/78 | HR 79 | Temp 98.8°F | Wt 157.5 lb

## 2024-08-15 DIAGNOSIS — K429 Umbilical hernia without obstruction or gangrene: Secondary | ICD-10-CM | POA: Diagnosis not present

## 2024-08-15 NOTE — Progress Notes (Signed)
     Established Patient Office Visit     CC/Reason for Visit: Concern for umbilical hernia  HPI: Crystal Meyer is a 53 y.o. female who is coming in today for the above mentioned reasons.  About 4 days ago she noticed a small lump above her umbilicus.  She is going through a CMA class and had her instructor look at her and she was advised to come see me regarding concerns for hernia.  She denies nausea, vomiting, constipation.   Past Medical/Surgical History: Past Medical History:  Diagnosis Date   Abnormal Pap smear    age 89, colpo and cryo   Chlamydia    Gonorrhea    Syphilis     Past Surgical History:  Procedure Laterality Date   CESAREAN SECTION  2002   1997, 2002   INDUCED ABORTION     x2   TUBAL LIGATION  2002    Social History:  reports that she has been smoking cigarettes. She has never used smokeless tobacco. She reports current alcohol use. She reports that she does not use drugs.  Allergies: Allergies  Allergen Reactions   Aspirin     Childhood reaction- dehydrated gave too many baby aspirins   Hydrocodone     Family History:  Family History  Problem Relation Age of Onset   Lupus Mother    Sickle cell trait Mother    Diabetes Father    Hypertension Father    Colon cancer Father 36   Healthy Sister    High blood pressure Brother    Stomach cancer Maternal Grandmother    Diabetes Paternal Grandmother    High blood pressure Paternal Grandmother    Blindness Paternal Grandmother        secondary to diabetes   Anesthesia problems Neg Hx      Current Outpatient Medications:    hydrocortisone cream 1 %, Apply 1 application topically 2 (two) times daily as needed for itching. For eczema, Disp: , Rfl:    ibuprofen  (ADVIL ) 600 MG tablet, Take 1 tablet (600 mg total) by mouth every 8 (eight) hours as needed., Disp: 90 tablet, Rfl: 1   Multiple Vitamins-Minerals (WOMENS MULTIVITAMIN PO), Take by mouth., Disp: , Rfl:    rosuvastatin  (CRESTOR ) 5  MG tablet, Take 1 tablet (5 mg total) by mouth daily., Disp: 90 tablet, Rfl: 1   Safety Seal Miscellaneous MISC, 10 mLs by Does not apply route daily. Floradix iron  & herbal supplement, Disp: , Rfl:    Vitamin D , Ergocalciferol , (DRISDOL ) 1.25 MG (50000 UNIT) CAPS capsule, Take 1 capsule (50,000 Units total) by mouth every 7 (seven) days for 12 doses., Disp: 12 capsule, Rfl: 0  Review of Systems:  Negative unless indicated in HPI.   Physical Exam: Vitals:   08/15/24 1439  BP: 120/78  Pulse: 79  Temp: 98.8 F (37.1 C)  TempSrc: Oral  SpO2: 98%  Weight: 157 lb 8 oz (71.4 kg)    Body mass index is 28.58 kg/m.   Physical Exam Abdominal:     Hernia: A hernia is present. Hernia is present in the umbilical area.      Impression and Plan:  Umbilical hernia without obstruction and without gangrene   - Small umbilical hernia is present on exam, advised observation for now.  Time spent:21 minutes reviewing chart, interviewing and examining patient and formulating plan of care.     Tully Theophilus Andrews, MD [facility]

## 2024-08-28 ENCOUNTER — Encounter: Payer: Self-pay | Admitting: Hematology and Oncology

## 2024-08-28 ENCOUNTER — Other Ambulatory Visit (HOSPITAL_COMMUNITY): Payer: Self-pay

## 2024-08-28 ENCOUNTER — Other Ambulatory Visit: Payer: Self-pay

## 2024-08-29 ENCOUNTER — Other Ambulatory Visit (HOSPITAL_COMMUNITY): Payer: Self-pay

## 2024-08-29 ENCOUNTER — Encounter: Payer: Self-pay | Admitting: Hematology and Oncology

## 2024-09-20 ENCOUNTER — Other Ambulatory Visit (HOSPITAL_COMMUNITY): Payer: Self-pay

## 2024-09-30 ENCOUNTER — Other Ambulatory Visit (HOSPITAL_COMMUNITY): Payer: Self-pay

## 2024-10-10 ENCOUNTER — Encounter: Payer: Self-pay | Admitting: Internal Medicine

## 2024-10-15 ENCOUNTER — Other Ambulatory Visit (HOSPITAL_COMMUNITY): Payer: Self-pay

## 2024-11-25 ENCOUNTER — Other Ambulatory Visit (HOSPITAL_COMMUNITY): Payer: Self-pay

## 2024-11-25 ENCOUNTER — Encounter: Payer: Self-pay | Admitting: Internal Medicine

## 2024-12-06 ENCOUNTER — Other Ambulatory Visit (HOSPITAL_COMMUNITY): Payer: Self-pay
